# Patient Record
Sex: Female | Born: 2000 | Race: White | Hispanic: No | Marital: Single | Smoking: Never smoker
Health system: Southern US, Community
[De-identification: ages and names within clinical notes are randomized; demographics above are authoritative.]

## PROBLEM LIST (undated history)

## (undated) DIAGNOSIS — F419 Anxiety disorder, unspecified: Secondary | ICD-10-CM

## (undated) DIAGNOSIS — J45909 Unspecified asthma, uncomplicated: Secondary | ICD-10-CM

## (undated) HISTORY — DX: Unspecified asthma, uncomplicated: J45.909

## (undated) HISTORY — DX: Anxiety disorder, unspecified: F41.9

---
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
## 2022-01-19 ENCOUNTER — Other Ambulatory Visit: Payer: Self-pay | Admitting: Child and Adolescent Psychiatry

## 2022-01-23 ENCOUNTER — Ambulatory Visit: Payer: Medicaid Other | Admitting: Family

## 2022-02-04 ENCOUNTER — Ambulatory Visit (INDEPENDENT_AMBULATORY_CARE_PROVIDER_SITE_OTHER): Payer: Medicaid Other | Admitting: Family

## 2022-02-04 ENCOUNTER — Encounter: Payer: Self-pay | Admitting: Family

## 2022-02-04 ENCOUNTER — Ambulatory Visit (INDEPENDENT_AMBULATORY_CARE_PROVIDER_SITE_OTHER)
Admission: RE | Admit: 2022-02-04 | Discharge: 2022-02-04 | Disposition: A | Discharge status: Home / Self Care | Payer: Medicaid Other | Source: Ambulatory Visit | Attending: Family | Admitting: Family

## 2022-02-04 ENCOUNTER — Telehealth: Payer: Self-pay

## 2022-02-04 VITALS — BP 92/60 | HR 78 | Temp 98.1°F | Resp 16 | Ht 64.0 in | Wt 98.4 lb

## 2022-02-04 DIAGNOSIS — R739 Hyperglycemia, unspecified: Secondary | ICD-10-CM | POA: Insufficient documentation

## 2022-02-04 DIAGNOSIS — Z3041 Encounter for surveillance of contraceptive pills: Secondary | ICD-10-CM

## 2022-02-04 DIAGNOSIS — J4520 Mild intermittent asthma, uncomplicated: Secondary | ICD-10-CM

## 2022-02-04 DIAGNOSIS — Z13220 Encounter for screening for lipoid disorders: Secondary | ICD-10-CM | POA: Insufficient documentation

## 2022-02-04 DIAGNOSIS — R5383 Other fatigue: Secondary | ICD-10-CM | POA: Insufficient documentation

## 2022-02-04 DIAGNOSIS — F4310 Post-traumatic stress disorder, unspecified: Secondary | ICD-10-CM

## 2022-02-04 DIAGNOSIS — R634 Abnormal weight loss: Secondary | ICD-10-CM | POA: Insufficient documentation

## 2022-02-04 DIAGNOSIS — G479 Sleep disorder, unspecified: Secondary | ICD-10-CM | POA: Insufficient documentation

## 2022-02-04 DIAGNOSIS — R922 Inconclusive mammogram: Secondary | ICD-10-CM

## 2022-02-04 DIAGNOSIS — L709 Acne, unspecified: Secondary | ICD-10-CM | POA: Diagnosis not present

## 2022-02-04 DIAGNOSIS — N946 Dysmenorrhea, unspecified: Secondary | ICD-10-CM | POA: Diagnosis not present

## 2022-02-04 DIAGNOSIS — K219 Gastro-esophageal reflux disease without esophagitis: Secondary | ICD-10-CM

## 2022-02-04 DIAGNOSIS — K5900 Constipation, unspecified: Secondary | ICD-10-CM | POA: Insufficient documentation

## 2022-02-04 DIAGNOSIS — R0683 Snoring: Secondary | ICD-10-CM | POA: Insufficient documentation

## 2022-02-04 DIAGNOSIS — R9230 Dense breasts, unspecified: Secondary | ICD-10-CM | POA: Insufficient documentation

## 2022-02-04 DIAGNOSIS — R102 Pelvic and perineal pain: Secondary | ICD-10-CM | POA: Insufficient documentation

## 2022-02-04 LAB — POCT URINE PREGNANCY: Preg Test, Ur: NEGATIVE

## 2022-02-04 MED ORDER — TRI-SPRINTEC 0.18/0.215/0.25 MG-35 MCG PO TABS: 1.0000 | ORAL_TABLET | Freq: Every day | ORAL | 11 refills | Status: DC

## 2022-02-04 NOTE — Assessment & Plan Note (Signed)
Lipid panel ordered pending results.   

## 2022-02-04 NOTE — Assessment & Plan Note (Signed)
tsh ordered pending results Work on eating at least three times a day

## 2022-02-04 NOTE — Assessment & Plan Note (Addendum)
Referral for sleep study  R/o sleep apnea

## 2022-02-04 NOTE — Assessment & Plan Note (Signed)
US pelvic and transvaginal US  Pending results

## 2022-02-04 NOTE — Assessment & Plan Note (Signed)
continue trazodone and f/u with psychiatry as scheduled  Sleep hygiene

## 2022-02-04 NOTE — Assessment & Plan Note (Signed)
a1c ordered

## 2022-02-04 NOTE — Telephone Encounter (Signed)
Left message to return call to our office. Need pt to come back to the office to get her blood work taking.

## 2022-02-04 NOTE — Assessment & Plan Note (Signed)
Increase fiber miralax daily Increase water in take kub abd  Consider gi referral if needed

## 2022-02-04 NOTE — Assessment & Plan Note (Signed)
Cont f/u with psychiatry as scheduled Continue sertraline 50 mg

## 2022-02-04 NOTE — Assessment & Plan Note (Signed)
bil breast u/s  Although suspected fibrocystic breasts Decrease caffeine intake

## 2022-02-04 NOTE — Assessment & Plan Note (Signed)
pcos workup

## 2022-02-04 NOTE — Assessment & Plan Note (Signed)
pcos workup pending

## 2022-02-04 NOTE — Patient Instructions (Addendum)
A referral was placed today for sleep study.  Please let us know if you have not heard back within 2 weeks about the referral.  Your imaging for pelvic ultrasound as well as bilateral breast ultrasound, call to get scheduled. Has been scheduled at the following location:   [facility],  [place], [facility] Phone [phone],  8-430 pm   Welcome to our clinic, I am happy to have you as my new patient. I am excited to continue on this healthcare journey with you.  Stop by the lab prior to leaving today. I will notify you of your results once received.   Please keep in mind Any my chart messages you send have up to a three business day turnaround for a response.  Phone calls may take up to a one full business day turnaround for a  response.   If you need a medication refill I recommend you request it through the pharmacy as this is easiest for [place] rather than sending a message and or phone call.   Due to recent changes in healthcare laws, you may see results of your imaging and/or laboratory studies on MyChart before I have had a chance to review them.  I understand that in some cases there may be results that are confusing or concerning to you. Please understand that not all results are received at the same time and often I may need to interpret multiple results in order to provide you with the best plan of care or course of treatment. Therefore, I ask that you please give me 2 business days to thoroughly review all your results before contacting my office for clarification. Should we see a critical lab result, you will be contacted sooner.   It was a pleasure seeing you today! Please do not hesitate to reach out with any questions and or concerns.  Regards,   Eugenia Pancoast FNP-C

## 2022-02-04 NOTE — Assessment & Plan Note (Signed)
Fatigue workup pending results

## 2022-02-04 NOTE — Addendum Note (Signed)
Addended by: Eugenia Pancoast on: 02/04/2022 12:01 PM   Modules accepted: Orders

## 2022-02-04 NOTE — Assessment & Plan Note (Signed)
Continue albuterol prn °

## 2022-02-04 NOTE — Progress Notes (Signed)
R   New Patient Office Visit  Subjective:  Patient ID: Suzanne Reed, female    DOB: Feb 04, 2001  Age: 21 y.o. MRN: [id number]  CC:  Chief Complaint  Patient presents with   Establish Care    HPI Suzanne Reed is here to establish care as a new patient.  Prior provider was: pedatrician, Dr. Theresia Bough Nogo  Pt is with acute concerns.   Increased bloating over the last few months and increased constipation. Having to take laxatives for this, thought at first bloating was from birth control. No abd pain only occasional in pelvic area. Periods now regular , not overly heavy. Used to be painful, but no longer. Has lost weight in the last few months or so. On average can go about 5-7 days on average. No blood in stool.   Wt Readings from Last 3 Encounters:  02/04/22 98 lb 6 oz (44.6 kg) (3 %, Z= -1.94)*  11/29/21 134 lb (60.8 kg) (63 %, Z= 0.33)*   * Growth percentiles are based on CDC (Girls, 2-20 Years) data.  Pt does state she does not feel she has ever been 134 pounds, usually around 103. Hard for her to gain weight.   Around breast, she feels pain at times. She does have wire on her bra but normal bra doesn't typically have wire. Pain around the breast at times, on a normal day 2/10. Doesn't really drink caffeine, occasional coffee.   chronic concerns:  PTSD, major depression: sees psychiatry every few months. On sertraline 25 mg and trazodone 50 mg nightly. Used to see a therapist in the past, but wasn't very helpful. Pt wil consider trying to get established with someone else.   Snoring, stops breathing per boyfriend, states she stops breathing while sleeping and he has to wake up her up. But has not been for about one month her boyfriend states.   Acid reflux: food dependent, when she eats spicy foods. Takes prilosec as needed. Not at current.  History reviewed. No pertinent past medical history.  History reviewed. No pertinent surgical history.  History reviewed. No pertinent family  history.  Social History   Socioeconomic History   Marital status: Single    Spouse name: Not on file   Number of children: Not on file   Years of education: Not on file   Highest education level: Not on file  Occupational History   Not on file  Tobacco Use   Smoking status: Never   Smokeless tobacco: Never  Vaping Use   Vaping Use: Never used  Substance and Sexual Activity   Alcohol use: Never   Drug use: Never   Sexual activity: Yes    Birth control/protection: Condom  Other Topics Concern   Not on file  Social History Narrative   Not on file   Social Determinants of Health   Financial Resource Strain: Not on file  Food Insecurity: Not on file  Transportation Needs: Not on file  Physical Activity: Not on file  Stress: Not on file  Social Connections: Not on file  Intimate Partner Violence: Not on file    Outpatient Medications Prior to Visit  Medication Sig Dispense Refill   loratadine (CLARITIN) 10 MG tablet Take 1 tablet by mouth daily.     montelukast (SINGULAIR) 10 MG tablet Take 10 mg by mouth at bedtime.     omeprazole (PRILOSEC) 20 MG capsule Take 20 mg by mouth daily.     PROAIR HFA 108 (90 Base) MCG/ACT inhaler Inhale 2  puffs into the lungs every 4 (four) hours as needed.     sertraline (ZOLOFT) 25 MG tablet Take 1 tablet (25 mg total) by mouth daily. 30 tablet 1   traZODone (DESYREL) 50 MG tablet TAKE 1/2 TO 1 TABLET BY MOUTH BY MOUTH AT BEDTIME AS NEEDED FOR SLEEP 30 tablet 0   loratadine (CLARITIN) 10 MG tablet Take 10 mg by mouth daily.     metoCLOPramide (REGLAN) 5 MG tablet Take 1 tablet (5 mg total) by mouth every 6 (six) hours as needed for nausea. 40 tablet 0   sertraline (ZOLOFT) 50 MG tablet Take 50 mg by mouth daily.     TRI-SPRINTEC 0.18/0.215/0.25 MG-35 MCG tablet Take 1 tablet by mouth daily.     No facility-administered medications prior to visit.    No Known Allergies      Objective:    Physical Exam Vitals reviewed.   Constitutional:      General: She is not in acute distress.    Appearance: She is underweight. She is not ill-appearing or toxic-appearing.  HENT:     Right Ear: Tympanic membrane normal.     Left Ear: Tympanic membrane normal.     Mouth/Throat:     Mouth: Mucous membranes are moist.     Pharynx: No pharyngeal swelling.     Tonsils: No tonsillar exudate.  Eyes:     Extraocular Movements: Extraocular movements intact.     Conjunctiva/sclera: Conjunctivae normal.     Pupils: Pupils are equal, round, and reactive to light.  Neck:     Thyroid: No thyroid mass.  Cardiovascular:     Rate and Rhythm: Normal rate and regular rhythm.  Pulmonary:     Effort: Pulmonary effort is normal.     Breath sounds: Normal breath sounds.  Chest:  Breasts:    Breasts are symmetrical.     Right: Tenderness present. No nipple discharge or skin change.     Left: Tenderness present. No nipple discharge or skin change.     Comments: Bil breast densities Abdominal:     General: Abdomen is flat. Bowel sounds are normal.     Palpations: Abdomen is soft.     Tenderness: There is abdominal tenderness in the suprapubic area.     Comments: Bil pelvic tenderness   Musculoskeletal:        General: Normal range of motion.  Lymphadenopathy:     Cervical:     Right cervical: No superficial cervical adenopathy.    Left cervical: No superficial cervical adenopathy.     Upper Body:     Right upper body: No supraclavicular or axillary adenopathy.     Left upper body: No supraclavicular or axillary adenopathy.  Skin:    General: Skin is warm.     Capillary Refill: Capillary refill takes less than 2 seconds.  Neurological:     General: No focal deficit present.     Mental Status: She is alert and oriented to person, place, and time.  Psychiatric:        Mood and Affect: Mood normal.        Behavior: Behavior normal.        Thought Content: Thought content normal.        Judgment: Judgment normal.     BP  92/60   Pulse 78   Temp 98.1 F (36.7 C)   Resp 16   Ht '5\' 4"'$  (1.626 m)   Wt 98 lb 6 oz (44.6 kg)   LMP  01/14/2022 (Exact Date)   SpO2 100%   BMI 16.89 kg/m  Wt Readings from Last 3 Encounters:  02/04/22 98 lb 6 oz (44.6 kg) (3 %, Z= -1.94)*  11/29/21 134 lb (60.8 kg) (63 %, Z= 0.33)*   * Growth percentiles are based on CDC (Girls, 2-20 Years) data.     Health Maintenance Due  Topic Date Due   HPV VACCINES (1 - 2-dose series) Never done   CHLAMYDIA SCREENING  Never done   HIV Screening  Never done   Hepatitis C Screening  Never done   TETANUS/TDAP  Never done       Topic Date Due   HPV VACCINES (1 - 2-dose series) Never done    No results found for: "TSH" Lab Results  Component Value Date   WBC 6.6 11/29/2021   HGB 13.9 11/29/2021   HCT 40.4 11/29/2021   MCV 85.4 11/29/2021   PLT 281 11/29/2021   Lab Results  Component Value Date   NA 134 (L) 11/29/2021   K 3.2 (L) 11/29/2021   CO2 25 11/29/2021   GLUCOSE 106 (H) 11/29/2021   BUN 9 11/29/2021   CREATININE 0.73 11/29/2021   CALCIUM 9.2 11/29/2021   ANIONGAP 8 11/29/2021   No results found for: "CHOL" No results found for: "HDL" No results found for: "LDLCALC" No results found for: "TRIG" No results found for: "CHOLHDL" No results found for: "HGBA1C"    Assessment & Plan:   Problem List Items Addressed This Visit       Respiratory   Mild intermittent asthma without complication - Primary    Continue albuterol prn         Musculoskeletal and Integument   Acne    pcos workup pending      Relevant Medications   TRI-SPRINTEC 0.18/0.215/0.25 MG-35 MCG tablet   Other Relevant Orders   POCT urine pregnancy (Completed)     Genitourinary   Painful menstrual periods    pcos workup       Relevant Orders   POCT urine pregnancy (Completed)   Follicle stimulating hormone   Prolactin   Testos,Total,Free and SHBG (Female)   US PELVIC COMPLETE WITH TRANSVAGINAL     Other   PTSD  (post-traumatic stress disorder)    Cont f/u with psychiatry as scheduled Continue sertraline 50 mg      Sleep disorder    continue trazodone and f/u with psychiatry as scheduled  Sleep hygiene      Snoring    Referral for sleep study  R/o sleep apnea      Relevant Orders   Ambulatory referral to Pulmonology   Pelvic pain    US pelvic and transvaginal US  Pending results      Relevant Orders   US PELVIC COMPLETE WITH TRANSVAGINAL   Constipation    Increase fiber miralax daily Increase water in take kub abd  Consider gi referral if needed       Relevant Orders   DG Abd 1 View   US PELVIC COMPLETE WITH TRANSVAGINAL   Other fatigue    Fatigue workup pending results      Relevant Orders   Comprehensive metabolic panel   CBC with Differential/Platelet   Hyperglycemia    a1c ordered      Relevant Orders   Comprehensive metabolic panel   Hemoglobin A1c   Screening for lipoid disorders    Lipid panel ordered pending results.        Relevant Orders  Lipid panel   Weight loss    tsh ordered pending results Work on eating at least three times a day       Relevant Orders   TSH   Dense breast tissue    bil breast u/s  Although suspected fibrocystic breasts Decrease caffeine intake      Relevant Orders   US BREAST LTD UNI LEFT INC AXILLA   US BREAST LTD UNI RIGHT INC AXILLA   Other Visit Diagnoses     Encounter for surveillance of contraceptive pills       Relevant Medications   TRI-SPRINTEC 0.18/0.215/0.25 MG-35 MCG tablet       Meds ordered this encounter  Medications   TRI-SPRINTEC 0.18/0.215/0.25 MG-35 MCG tablet    Sig: Take 1 tablet by mouth daily.    Dispense:  28 tablet    Refill:  11    Order Specific Question:   Supervising Provider    Answer:   Diona Browner, AMY E [[id number]]    Follow-up: Return in about 1 month (around 03/06/2022) for f/u on appointment.    Eugenia Pancoast, FNP

## 2022-02-05 NOTE — Progress Notes (Signed)
Moderate stool amount in intestines. Recommend increasing water intake and miralax ok to take daily to get stool moving.

## 2022-02-06 ENCOUNTER — Other Ambulatory Visit (INDEPENDENT_AMBULATORY_CARE_PROVIDER_SITE_OTHER): Payer: Medicaid Other

## 2022-02-06 DIAGNOSIS — R739 Hyperglycemia, unspecified: Secondary | ICD-10-CM | POA: Diagnosis not present

## 2022-02-06 DIAGNOSIS — N946 Dysmenorrhea, unspecified: Secondary | ICD-10-CM

## 2022-02-06 DIAGNOSIS — Z13220 Encounter for screening for lipoid disorders: Secondary | ICD-10-CM

## 2022-02-06 DIAGNOSIS — R634 Abnormal weight loss: Secondary | ICD-10-CM

## 2022-02-06 DIAGNOSIS — R5383 Other fatigue: Secondary | ICD-10-CM | POA: Diagnosis not present

## 2022-02-06 LAB — COMPREHENSIVE METABOLIC PANEL
ALT: 11 U/L (ref 0–35)
AST: 16 U/L (ref 0–37)
Albumin: 4 g/dL (ref 3.5–5.2)
Alkaline Phosphatase: 66 U/L (ref 47–119)
BUN: 6 mg/dL (ref 6–23)
CO2: 27 mEq/L (ref 19–32)
Calcium: 9.2 mg/dL (ref 8.4–10.5)
Chloride: 103 mEq/L (ref 96–112)
Creatinine, Ser: 0.7 mg/dL (ref 0.40–1.20)
GFR: 125.48 mL/min (ref >60.00)
Glucose, Bld: 87 mg/dL (ref 70–99)
Potassium: 3.8 mEq/L (ref 3.5–5.1)
Sodium: 137 mEq/L (ref 135–145)
Total Bilirubin: 0.6 mg/dL (ref 0.2–1.2)
Total Protein: 6.6 g/dL (ref 6.0–8.3)

## 2022-02-06 LAB — LIPID PANEL
Cholesterol: 188 mg/dL (ref 0–200)
HDL: 70.5 mg/dL (ref >39.00)
LDL Cholesterol: 100 mg/dL — ABNORMAL HIGH (ref 0–99)
NonHDL: 117.81
Total CHOL/HDL Ratio: 3
Triglycerides: 88 mg/dL (ref 0.0–149.0)
VLDL: 17.6 mg/dL (ref 0.0–40.0)

## 2022-02-06 LAB — CBC WITH DIFFERENTIAL/PLATELET
Basophils Absolute: 0 10*3/uL (ref 0.0–0.1)
Basophils Relative: 0.3 % (ref 0.0–3.0)
Eosinophils Absolute: 0.1 10*3/uL (ref 0.0–0.7)
Eosinophils Relative: 1.4 % (ref 0.0–5.0)
HCT: 41.6 % (ref 36.0–49.0)
Hemoglobin: 14.1 g/dL (ref 12.0–16.0)
Lymphocytes Relative: 30.3 % (ref 24.0–48.0)
Lymphs Abs: 2.6 10*3/uL (ref 0.7–4.0)
MCHC: 34 g/dL (ref 31.0–37.0)
MCV: 88.1 fl (ref 78.0–98.0)
Monocytes Absolute: 0.7 10*3/uL (ref 0.1–1.0)
Monocytes Relative: 8.8 % (ref 3.0–12.0)
Neutro Abs: 5 10*3/uL (ref 1.4–7.7)
Neutrophils Relative %: 59.2 % (ref 43.0–71.0)
Platelets: 259 10*3/uL (ref 150.0–575.0)
RBC: 4.72 Mil/uL (ref 3.80–5.70)
RDW: 13 % (ref 11.4–15.5)
WBC: 8.5 10*3/uL (ref 4.5–13.5)

## 2022-02-06 LAB — TSH: TSH: 1.29 u[IU]/mL (ref 0.40–5.00)

## 2022-02-06 LAB — HEMOGLOBIN A1C: Hgb A1c MFr Bld: 5.1 % (ref 4.6–6.5)

## 2022-02-06 LAB — FOLLICLE STIMULATING HORMONE: FSH: 2.8 m[IU]/mL

## 2022-02-07 MED ORDER — PROAIR HFA 108 (90 BASE) MCG/ACT IN AERS: 2.0000 | INHALATION_SPRAY | RESPIRATORY_TRACT | 0 refills | Status: DC | PRN

## 2022-02-07 MED ORDER — OMEPRAZOLE 20 MG PO CPDR: 20.0000 mg | DELAYED_RELEASE_CAPSULE | Freq: Every day | ORAL | 0 refills | Status: DC

## 2022-02-07 MED ORDER — MONTELUKAST SODIUM 10 MG PO TABS: 10.0000 mg | ORAL_TABLET | Freq: Every day | ORAL | 1 refills | Status: DC

## 2022-02-10 ENCOUNTER — Telehealth: Payer: Self-pay | Admitting: Child and Adolescent Psychiatry

## 2022-02-11 ENCOUNTER — Telehealth: Payer: Medicaid Other | Admitting: Child and Adolescent Psychiatry

## 2022-02-13 ENCOUNTER — Other Ambulatory Visit: Payer: Self-pay | Admitting: Family

## 2022-02-13 LAB — TESTOS,TOTAL,FREE AND SHBG (FEMALE)
Free Testosterone: 2.5 pg/mL (ref 0.1–6.4)
Sex Hormone Binding: 80 nmol/L (ref 17–124)
Testosterone, Total, LC-MS-MS: 31 ng/dL (ref 2–45)

## 2022-02-13 LAB — PROLACTIN: Prolactin: 15.1 ng/mL

## 2022-02-13 MED ORDER — LORATADINE 10 MG PO TABS: 10.0000 mg | ORAL_TABLET | Freq: Every day | ORAL | 1 refills | Status: DC

## 2022-02-16 ENCOUNTER — Other Ambulatory Visit: Payer: Self-pay | Admitting: Child and Adolescent Psychiatry

## 2022-02-21 ENCOUNTER — Ambulatory Visit
Admission: RE | Admit: 2022-02-21 | Discharge: 2022-02-21 | Disposition: A | Discharge status: Home / Self Care | Payer: Medicaid Other | Source: Ambulatory Visit | Attending: Family | Admitting: Family

## 2022-02-21 DIAGNOSIS — N946 Dysmenorrhea, unspecified: Secondary | ICD-10-CM | POA: Insufficient documentation

## 2022-02-21 DIAGNOSIS — K5900 Constipation, unspecified: Secondary | ICD-10-CM | POA: Insufficient documentation

## 2022-02-21 DIAGNOSIS — R102 Pelvic and perineal pain: Secondary | ICD-10-CM | POA: Insufficient documentation

## 2022-03-08 ENCOUNTER — Encounter: Payer: Self-pay | Admitting: Family

## 2022-03-27 ENCOUNTER — Encounter: Payer: Self-pay | Admitting: Family

## 2022-03-30 ENCOUNTER — Encounter: Payer: Self-pay | Admitting: Family

## 2022-04-10 ENCOUNTER — Ambulatory Visit (INDEPENDENT_AMBULATORY_CARE_PROVIDER_SITE_OTHER): Payer: Medicaid Other | Admitting: Primary Care

## 2022-04-10 ENCOUNTER — Encounter: Payer: Self-pay | Admitting: Primary Care

## 2022-04-10 ENCOUNTER — Telehealth: Payer: Self-pay

## 2022-04-10 VITALS — BP 100/64 | HR 82 | Temp 98.6°F | Ht 64.0 in | Wt 100.0 lb

## 2022-04-10 DIAGNOSIS — R300 Dysuria: Secondary | ICD-10-CM | POA: Diagnosis not present

## 2022-04-10 LAB — POC URINALSYSI DIPSTICK (AUTOMATED)
Bilirubin, UA: NEGATIVE
Blood, UA: NEGATIVE
Glucose, UA: NEGATIVE
Leukocytes, UA: NEGATIVE
Nitrite, UA: NEGATIVE
Protein, UA: POSITIVE — AB
Spec Grav, UA: 1.01 (ref 1.010–1.025)
Urobilinogen, UA: 1 E.U./dL
pH, UA: 6 (ref 5.0–8.0)

## 2022-04-10 NOTE — Patient Instructions (Signed)
We will be in touch once we receive your urine test results.  Continue to drink plenty of water.  It was a pleasure meeting you!

## 2022-04-10 NOTE — Progress Notes (Signed)
Subjective:    Patient ID: Suzanne Reed, female    DOB: 2001-04-15, 21 y.o.   MRN: [id number]  Dysuria  Pertinent negatives include no chills or frequency.    Suzanne Reed is a very pleasant 21 y.o. female patient of Tabitha, NP with a history of dysmenorrhea, sleep disorder, PTSD, fatigue, who presents today to discuss dysuria.   Symptoms also include vaginal burning after urination, vaginal itching, vaginal irritation. All symptoms began about 2 weeks ago.   She denies vaginal discharge, hematuria, abdominal pain. LMP was 04/02/22. She took 2 AZO pills one week ago with some improvement.   She endorses a history of UTI, last one was one month ago, she did not seek treatment as her symptoms historically will resolve after taking AZO.   She is sexually active, does use condoms, same partner.    Review of Systems  Constitutional:  Negative for chills and fever.  Gastrointestinal:  Negative for abdominal pain.  Genitourinary:  Positive for dysuria. Negative for difficulty urinating, frequency, menstrual problem, pelvic pain and vaginal discharge.       Vaginal irritation         History reviewed. No pertinent past medical history.  Social History   Socioeconomic History   Marital status: Single    Spouse name: Not on file   Number of children: Not on file   Years of education: Not on file   Highest education level: Not on file  Occupational History   Not on file  Tobacco Use   Smoking status: Never   Smokeless tobacco: Never  Vaping Use   Vaping Use: Never used  Substance and Sexual Activity   Alcohol use: Never   Drug use: Never   Sexual activity: Yes    Birth control/protection: Condom  Other Topics Concern   Not on file  Social History Narrative   Not on file   Social Determinants of Health   Financial Resource Strain: Not on file  Food Insecurity: Not on file  Transportation Needs: Not on file  Physical Activity: Not on file  Stress: Not on file  Social  Connections: Not on file  Intimate Partner Violence: Not on file    History reviewed. No pertinent surgical history.  History reviewed. No pertinent family history.  No Known Allergies  Current Outpatient Medications on File Prior to Visit  Medication Sig Dispense Refill   loratadine (CLARITIN) 10 MG tablet Take 1 tablet (10 mg total) by mouth daily. 90 tablet 1   montelukast (SINGULAIR) 10 MG tablet Take 1 tablet (10 mg total) by mouth at bedtime. 90 tablet 1   omeprazole (PRILOSEC) 20 MG capsule Take 1 capsule (20 mg total) by mouth daily. 90 capsule 0   PROAIR HFA 108 (90 Base) MCG/ACT inhaler Inhale 2 puffs into the lungs every 4 (four) hours as needed. 1 each 0   traZODone (DESYREL) 50 MG tablet TAKE 1/2 TO 1 TABLET BY MOUTH BY MOUTH AT BEDTIME AS NEEDED FOR SLEEP 30 tablet 0   TRI-SPRINTEC 0.18/0.215/0.25 MG-35 MCG tablet Take 1 tablet by mouth daily. 28 tablet 11   sertraline (ZOLOFT) 25 MG tablet Take 1 tablet (25 mg total) by mouth daily. (Patient not taking: Reported on 04/10/2022) 30 tablet 1   No current facility-administered medications on file prior to visit.    BP 100/64   Pulse 82   Temp 98.6 F (37 C) (Oral)   Ht '5\' 4"'$  (1.626 m)   Wt 100 lb (45.4 kg)  SpO2 97%   BMI 17.16 kg/m  Objective:   Physical Exam Constitutional:      Appearance: She is not ill-appearing.  Cardiovascular:     Rate and Rhythm: Normal rate and regular rhythm.  Pulmonary:     Effort: Pulmonary effort is normal.     Breath sounds: Normal breath sounds.  Abdominal:     General: Bowel sounds are normal.     Palpations: Abdomen is soft.     Tenderness: There is abdominal tenderness in the suprapubic area.  Musculoskeletal:     Cervical back: Neck supple.  Skin:    General: Skin is warm and dry.           Assessment & Plan:   Problem List Items Addressed This Visit       Other   Dysuria - Primary    UA today grossly negative for infection. Reviewed recent glucose level  which was normal  Checking for STD's including gonorrhea, chlamydia, trichomonas.   Encouraged good water intake. Await results.       Relevant Orders   C. trachomatis/N. gonorrhoeae RNA   Trichomonas vaginalis, RNA   POCT Urinalysis Dipstick (Automated) (Completed)       Pleas Koch, NP

## 2022-04-10 NOTE — Assessment & Plan Note (Addendum)
UA today grossly negative for infection. Reviewed recent glucose level which was normal  Checking for STD's including gonorrhea, chlamydia, trichomonas.   Encouraged good water intake. Await results.

## 2022-04-10 NOTE — Telephone Encounter (Signed)
Pt called wondering why there would be protein in her urine. She googled it and now she is worried that it has something to do with her kidney. She would like a explanation.

## 2022-04-11 LAB — C. TRACHOMATIS/N. GONORRHOEAE RNA
C. trachomatis RNA, TMA: DETECTED — AB
N. gonorrhoeae RNA, TMA: NOT DETECTED

## 2022-04-11 LAB — TRICHOMONAS VAGINALIS, PROBE AMP: Trichomonas vaginalis RNA: NOT DETECTED

## 2022-04-12 ENCOUNTER — Encounter: Payer: Self-pay | Admitting: Family

## 2022-04-13 ENCOUNTER — Other Ambulatory Visit: Payer: Self-pay | Admitting: Primary Care

## 2022-04-13 DIAGNOSIS — A749 Chlamydial infection, unspecified: Secondary | ICD-10-CM

## 2022-04-13 MED ORDER — DOXYCYCLINE HYCLATE 100 MG PO CAPS: 100.0000 mg | ORAL_CAPSULE | Freq: Two times a day (BID) | ORAL | 0 refills | Status: DC

## 2022-04-13 NOTE — Telephone Encounter (Signed)
Please ensure patient has reviewed my comments under her results in [place].  She tested positive for chlamydia, I suspect the protein in her urine is secondary to that.  I do recommend she follow-up with Tabatha for any ongoing symptoms and to repeat urinalysis in the future when she has completed her antibiotic treatment for chlamydia.

## 2022-04-14 NOTE — Telephone Encounter (Signed)
Noted, thanks for seeing her Dr. Carlis Abbott.

## 2022-04-24 ENCOUNTER — Encounter: Payer: Self-pay | Admitting: Family

## 2022-04-24 DIAGNOSIS — G479 Sleep disorder, unspecified: Secondary | ICD-10-CM

## 2022-04-24 DIAGNOSIS — F4310 Post-traumatic stress disorder, unspecified: Secondary | ICD-10-CM

## 2022-04-25 ENCOUNTER — Encounter: Payer: Self-pay | Admitting: Family

## 2022-05-04 ENCOUNTER — Encounter: Payer: Self-pay | Admitting: Family

## 2022-05-06 NOTE — Telephone Encounter (Signed)
Left message to return call to our office. Pt needs an appointment.

## 2022-06-23 ENCOUNTER — Encounter: Payer: Self-pay | Admitting: Family

## 2022-06-23 ENCOUNTER — Ambulatory Visit (INDEPENDENT_AMBULATORY_CARE_PROVIDER_SITE_OTHER): Payer: Medicaid Other | Admitting: Family

## 2022-06-23 VITALS — BP 102/62 | HR 76 | Temp 98.2°F | Resp 16 | Ht 64.0 in | Wt 105.0 lb

## 2022-06-23 DIAGNOSIS — K5909 Other constipation: Secondary | ICD-10-CM

## 2022-06-23 DIAGNOSIS — K5900 Constipation, unspecified: Secondary | ICD-10-CM

## 2022-06-23 MED ORDER — LINACLOTIDE 145 MCG PO CAPS: 145.0000 ug | ORAL_CAPSULE | Freq: Every day | ORAL | 0 refills | Status: DC

## 2022-06-23 NOTE — Assessment & Plan Note (Signed)
Trial start linzess  Work on diet and recommend that you start eating every 2-3 hours. Increase your water intake to a goal of about 64 ounces.  Referral placed for GI if ongoing.

## 2022-06-23 NOTE — Patient Instructions (Addendum)
A referral was placed today for GI.  Please let us know if you have not heard back within 2 weeks about the referral.  Add fiber supplement once daily.  Add a probiotic (such as Florastor) daily. Drink 64 oz of water a day. Eat lots of fresh fruit and veggies. Ensure regular exercise.    If you are not able to have regular BM's with the above regimen, you may add miralax 1 tablespoon daily.  Increase or decrease amount/frequency as needed to ensure 1 soft BM/day.    Regards,   Eugenia Pancoast FNP-C

## 2022-06-23 NOTE — Progress Notes (Signed)
Established Patient Office Visit  Subjective:  Patient ID: Suzanne Reed, female    DOB: Oct 18, 2000  Age: 21 y.o. MRN: [id number]  CC:  Chief Complaint  Patient presents with   Bloated   Constipation    X a couple of months    HPI Suzanne Reed is here today with concerns.   She is still having constipation and feeling bloated. She is trying benefiber over the counter with only mild relief. She is still feeling bloated. Her last bowel movement was about one week ago, and when she does have a bowel movement she has barely any bowel movement. She doesn't feel complete relief. This is tpyical for her to not have a bowel movement 1-2 times a week.it has worsened to once a week or once every two weeks.she has tried to take miralax once daily, but states that this is not overly helpful for her.  On average she will eat breakfast and dinner. Typically for breakfast she will eat a donut, and or a sausage biscuit. If she is home she will eat cereal and or oatmeal. For dinner she will typically eat a protein and vegetable. Last night had hamburgers and broccoli with noodles.   She drinks about three bottles of water daily.   FYI, she has also stopped taking her birth control as she did not want to be on this anymore. Periods since being off birth control they are starting to settle back, last period was about one month ago, due again in four days. Periods are not overly painful. Periods are pretty heavy. Bloating does not seem to fluctuate with her periods.   Wt Readings from Last 3 Encounters:  06/23/22 105 lb (47.6 kg) (8 %, Z= -1.39)*  04/10/22 100 lb (45.4 kg) (4 %, Z= -1.80)*  02/04/22 98 lb 6 oz (44.6 kg) (3 %, Z= -1.94)*   * Growth percentiles are based on CDC (Girls, 2-20 Years) data.     No past medical history on file.  No past surgical history on file.  No family history on file.  Social History   Socioeconomic History   Marital status: Single    Spouse name: Not on file    Number of children: Not on file   Years of education: Not on file   Highest education level: Not on file  Occupational History   Not on file  Tobacco Use   Smoking status: Never   Smokeless tobacco: Never  Vaping Use   Vaping Use: Never used  Substance and Sexual Activity   Alcohol use: Never   Drug use: Never   Sexual activity: Yes    Birth control/protection: Condom  Other Topics Concern   Not on file  Social History Narrative   Not on file   Social Determinants of Health   Financial Resource Strain: Not on file  Food Insecurity: Not on file  Transportation Needs: Not on file  Physical Activity: Not on file  Stress: Not on file  Social Connections: Not on file  Intimate Partner Violence: Not on file    Outpatient Medications Prior to Visit  Medication Sig Dispense Refill   loratadine (CLARITIN) 10 MG tablet Take 1 tablet (10 mg total) by mouth daily. 90 tablet 1   montelukast (SINGULAIR) 10 MG tablet Take 1 tablet (10 mg total) by mouth at bedtime. 90 tablet 1   omeprazole (PRILOSEC) 20 MG capsule Take 1 capsule (20 mg total) by mouth daily. 90 capsule 0   PROAIR HFA 108 (  90 Base) MCG/ACT inhaler Inhale 2 puffs into the lungs every 4 (four) hours as needed. 1 each 0   sertraline (ZOLOFT) 25 MG tablet Take 1 tablet (25 mg total) by mouth daily. 30 tablet 1   traZODone (DESYREL) 50 MG tablet TAKE 1/2 TO 1 TABLET BY MOUTH BY MOUTH AT BEDTIME AS NEEDED FOR SLEEP 30 tablet 0   TRI-SPRINTEC 0.18/0.215/0.25 MG-35 MCG tablet Take 1 tablet by mouth daily. 28 tablet 11   doxycycline (VIBRAMYCIN) 100 MG capsule Take 1 capsule (100 mg total) by mouth 2 (two) times daily. For infection. 14 capsule 0   No facility-administered medications prior to visit.    No Known Allergies      Objective:    Physical Exam Constitutional:      General: She is not in acute distress.    Appearance: Normal appearance. She is not ill-appearing, toxic-appearing or diaphoretic.   Cardiovascular:     Rate and Rhythm: Normal rate and regular rhythm.  Pulmonary:     Effort: Pulmonary effort is normal.  Abdominal:     General: Abdomen is flat.     Palpations: There is no mass.     Tenderness: There is abdominal tenderness in the left lower quadrant. There is no rebound.     Hernia: No hernia is present.  Neurological:     General: No focal deficit present.     Mental Status: She is alert and oriented to person, place, and time. Mental status is at baseline.  Psychiatric:        Mood and Affect: Mood normal.        Behavior: Behavior normal.        Thought Content: Thought content normal.        Judgment: Judgment normal.     BP 102/62   Pulse 76   Temp 98.2 F (36.8 C)   Resp 16   Ht '5\' 4"'$  (1.626 m)   Wt 105 lb (47.6 kg)   LMP 05/30/2022 (Exact Date)   SpO2 99%   BMI 18.02 kg/m  Wt Readings from Last 3 Encounters:  06/23/22 105 lb (47.6 kg) (8 %, Z= -1.39)*  04/10/22 100 lb (45.4 kg) (4 %, Z= -1.80)*  02/04/22 98 lb 6 oz (44.6 kg) (3 %, Z= -1.94)*   * Growth percentiles are based on CDC (Girls, 2-20 Years) data.     Health Maintenance Due  Topic Date Due   HPV VACCINES (1 - 2-dose series) Never done   HIV Screening  Never done   Hepatitis C Screening  Never done   TETANUS/TDAP  Never done       Topic Date Due   HPV VACCINES (1 - 2-dose series) Never done    Lab Results  Component Value Date   TSH 1.29 02/06/2022   Lab Results  Component Value Date   WBC 8.5 02/06/2022   HGB 14.1 02/06/2022   HCT 41.6 02/06/2022   MCV 88.1 02/06/2022   PLT 259.0 02/06/2022   Lab Results  Component Value Date   NA 137 02/06/2022   K 3.8 02/06/2022   CO2 27 02/06/2022   GLUCOSE 87 02/06/2022   BUN 6 02/06/2022   CREATININE 0.70 02/06/2022   BILITOT 0.6 02/06/2022   ALKPHOS 66 02/06/2022   AST 16 02/06/2022   ALT 11 02/06/2022   PROT 6.6 02/06/2022   ALBUMIN 4.0 02/06/2022   CALCIUM 9.2 02/06/2022   ANIONGAP 8 11/29/2021   GFR 125.48  02/06/2022  Lab Results  Component Value Date   HGBA1C 5.1 02/06/2022      Assessment & Plan:   Problem List Items Addressed This Visit       Digestive   Chronic constipation - Primary    Trial start linzess  Work on diet and recommend that you start eating every 2-3 hours. Increase your water intake to a goal of about 64 ounces.  Referral placed for GI if ongoing.        Relevant Medications   linaclotide (LINZESS) 145 MCG CAPS capsule   Other Relevant Orders   Ambulatory referral to Gastroenterology     Other   RESOLVED: Constipation    Trial start linzess  Work on diet and recommend that you start eating every 2-3 hours. Increase your water intake to a goal of about 64 ounces.       Relevant Orders   Ambulatory referral to Gastroenterology    Meds ordered this encounter  Medications   linaclotide (LINZESS) 145 MCG CAPS capsule    Sig: Take 1 capsule (145 mcg total) by mouth daily before breakfast.    Dispense:  30 capsule    Refill:  0    Order Specific Question:   Supervising Provider    Answer:   Diona Browner, AMY E [[id number]]    Follow-up: Return in about 1 month (around 07/23/2022) for f/u constipation .    Eugenia Pancoast, FNP

## 2022-06-23 NOTE — Assessment & Plan Note (Signed)
Trial start linzess  Work on diet and recommend that you start eating every 2-3 hours. Increase your water intake to a goal of about 64 ounces.

## 2022-06-24 ENCOUNTER — Encounter: Payer: Self-pay | Admitting: Family

## 2022-07-18 ENCOUNTER — Encounter: Payer: Self-pay | Admitting: Family

## 2022-07-18 NOTE — Telephone Encounter (Signed)
Noted  

## 2022-07-18 NOTE — Telephone Encounter (Signed)
I spoke with pt; pt said first time had pain with sex was last wk; today during sex pt had lower pain all the way across lower abd that continues now. Pt said if stands lower abd pain is sharp but if sitting more of a pressure feeling. Pt said abd pain level now is 5-6. Pt has usual vaginal discharge with no vaginal or perineal irritation.pt said she does not have fever. No UTI symptoms.Pt said LMP 06/26/22. Pt stopped any form of birth control 1 - 2 months ago. Pt does not have gyn. Pt said she will get someone to take her to UC for evaluation this evening. Pt will cb next wk with update. Sending not to Red Christians FNP who is out of office, dugal pool and Gentry Fitz NP who is in office.

## 2022-07-19 ENCOUNTER — Other Ambulatory Visit: Payer: Self-pay | Admitting: Family

## 2022-07-19 DIAGNOSIS — K5909 Other constipation: Secondary | ICD-10-CM

## 2022-07-21 NOTE — Telephone Encounter (Signed)
Left message to return call to our office. Pt needs an appointment to see Tabitha.

## 2022-07-21 NOTE — Telephone Encounter (Signed)
Agree with recommendations.   Claiborne Billings can we please call to f/u with pt? Sounds like sh needs an appt if wasn't addressed. Especially with recent h/o STD

## 2022-07-24 ENCOUNTER — Encounter: Payer: Self-pay | Admitting: Family

## 2022-07-24 NOTE — Telephone Encounter (Signed)
Patient called at 2:35 today stating she feels better and has no problems so she does not need an appointment and that she is getting refills for birth control that she has been off of for months.

## 2022-07-24 NOTE — Telephone Encounter (Signed)
Left message for Patient to call back to schedule

## 2022-07-24 NOTE — Telephone Encounter (Signed)
Patient returned call after this and states she feels better and does not need an appointment.

## 2022-08-22 ENCOUNTER — Other Ambulatory Visit: Payer: Self-pay | Admitting: Family

## 2022-08-22 DIAGNOSIS — K5909 Other constipation: Secondary | ICD-10-CM

## 2022-08-26 IMAGING — DX DG ABDOMEN 1V
2 series · 2 of 2 positions shown · non-contrast
Comparison: Remote CT 11/01/2008

CLINICAL DATA: Constipation.  Abdominal discomfort.

EXAM:
ABDOMEN - 1 VIEW

[abdomen supine ap (1 of 2)]
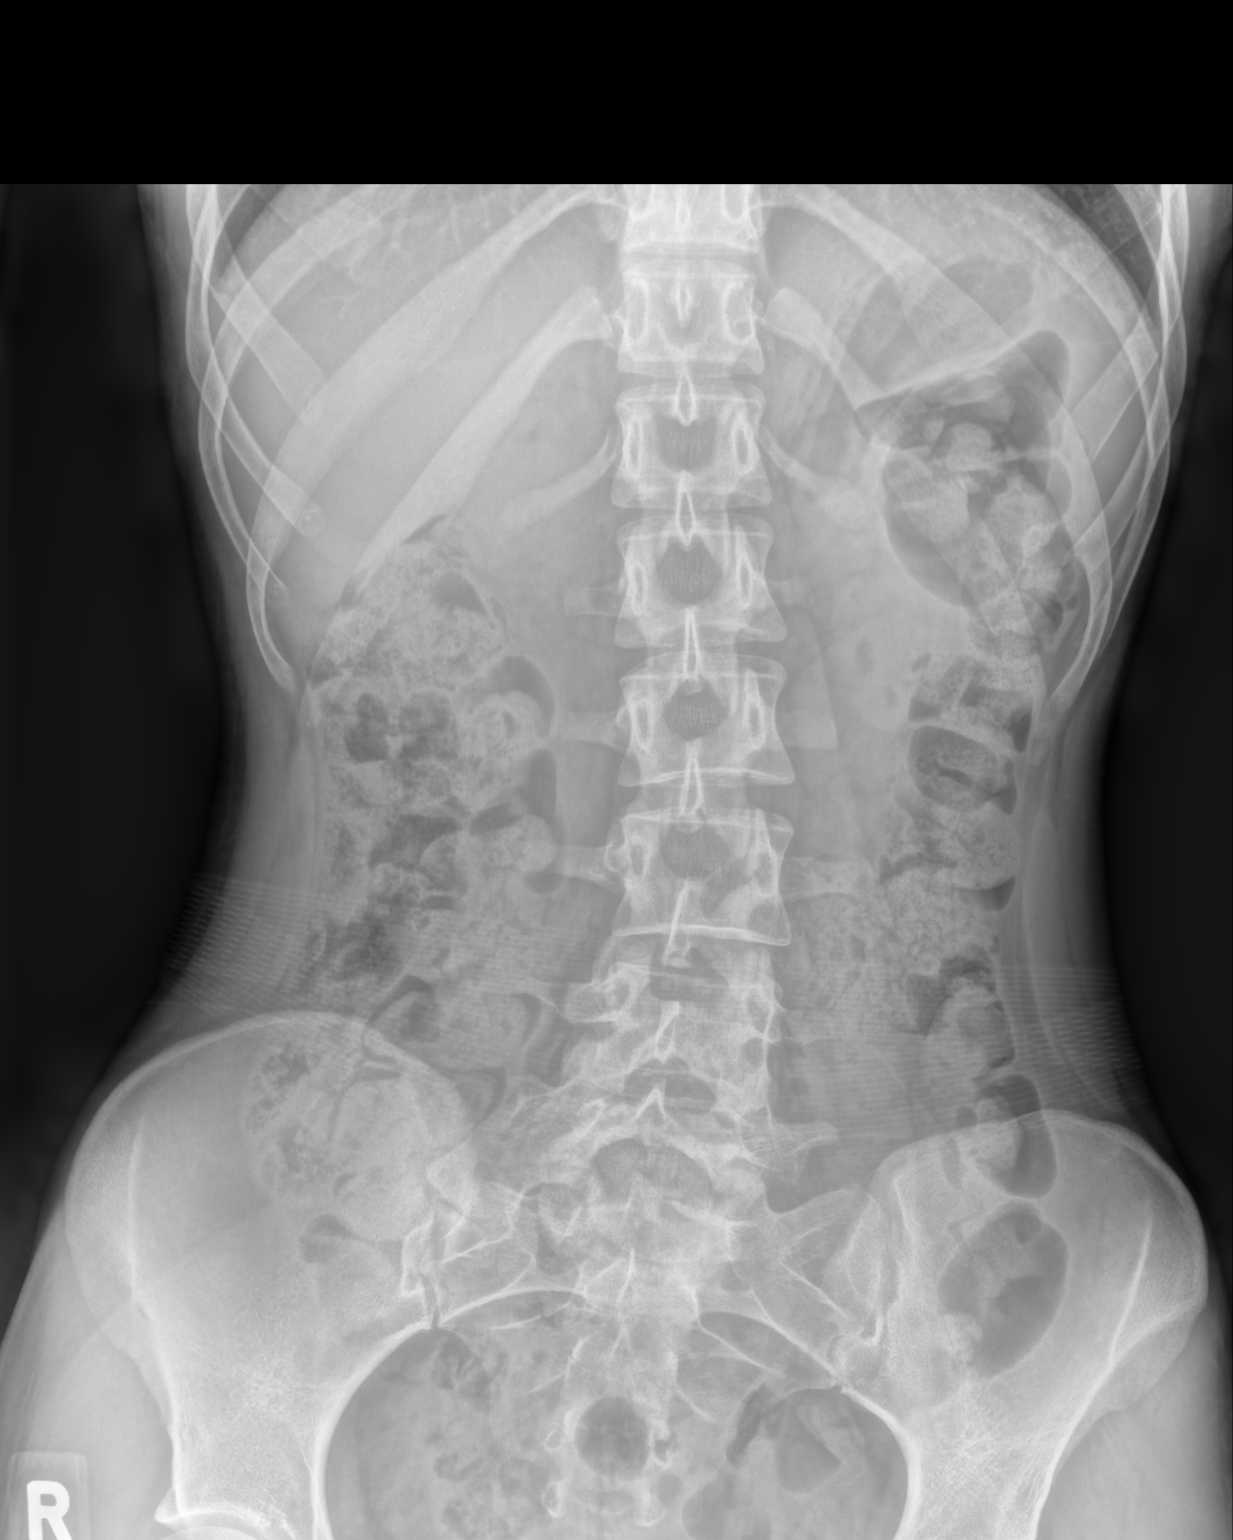

[abdomen supine ap (2 of 2)]
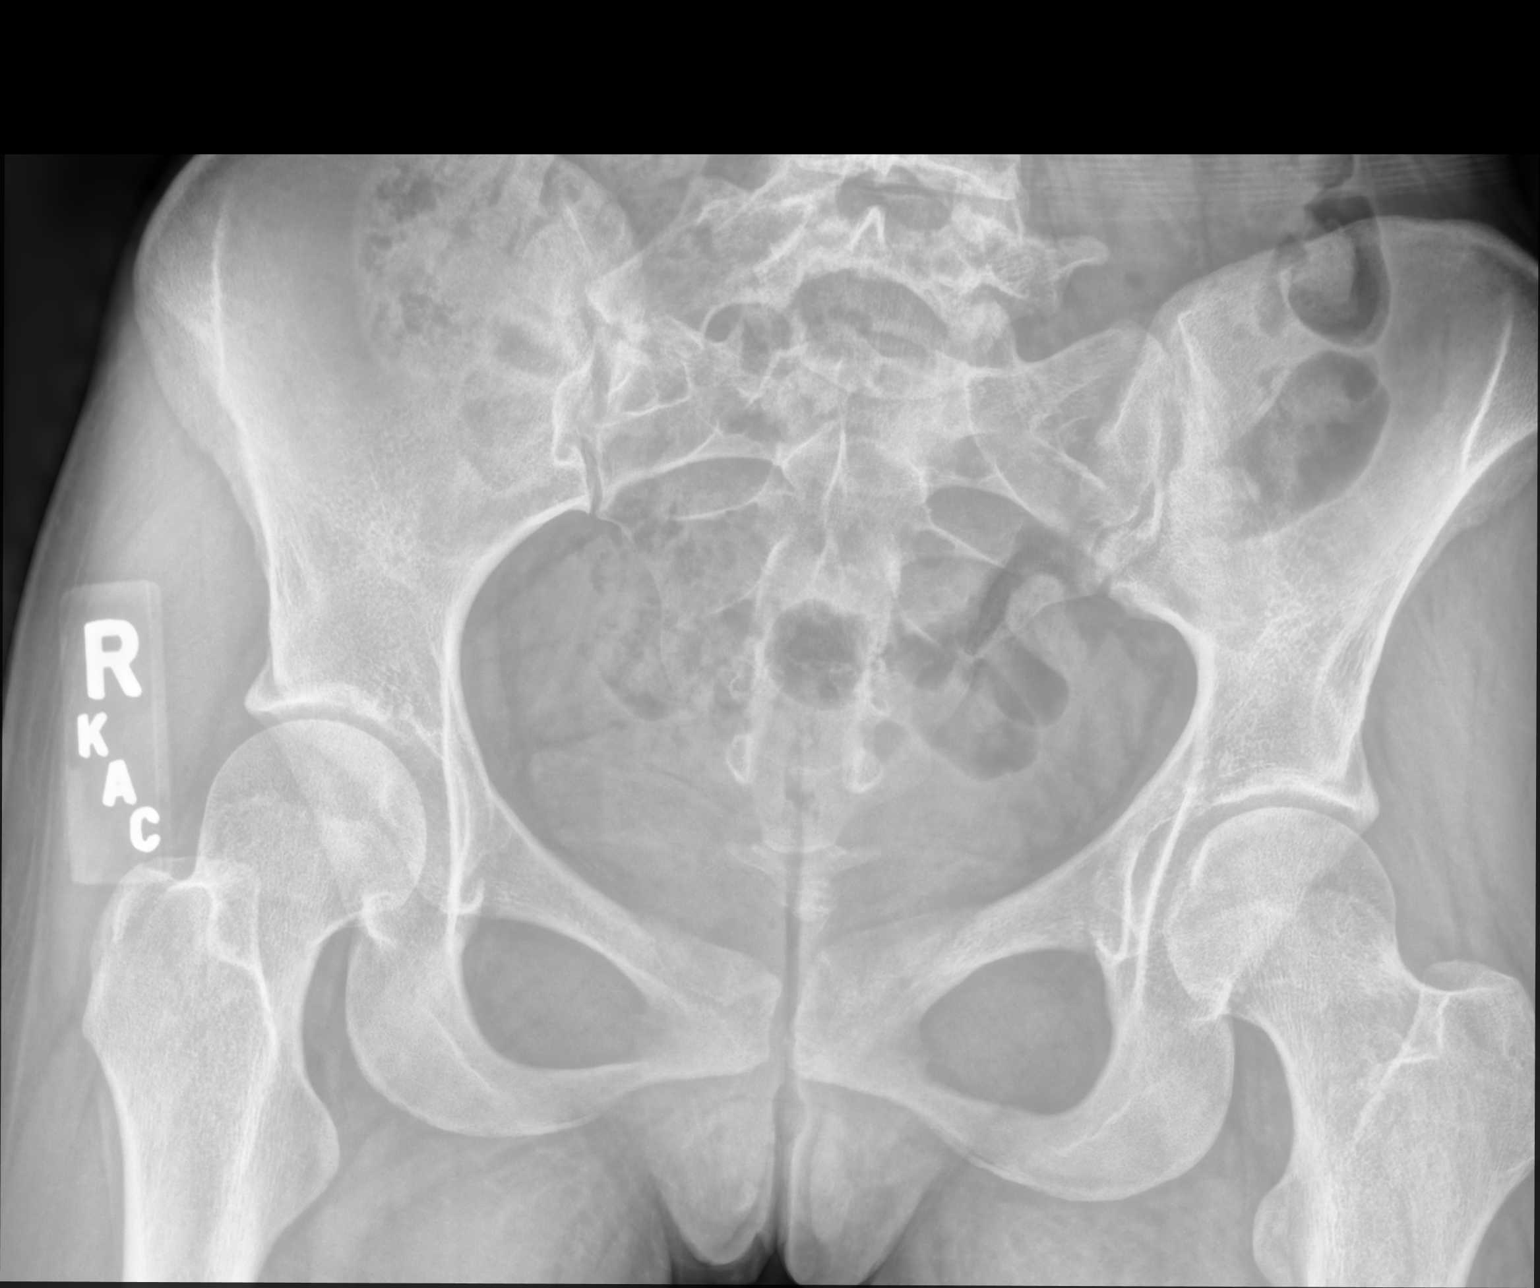

[2 of 2 positions shown; findings below may reference images not displayed]

FINDINGS: Divided supine views of the abdomen obtained. Moderate diffuse
colonic stool burden, no abnormal rectal distention. No small bowel
dilatation or evidence of obstruction. No abnormal gastric
distension. No concerning intraabdominal mass effect. No radiopaque
calculi. The included lung bases are clear. Normal osseous
structures.
IMPRESSION: Moderate diffuse colonic stool burden. No bowel obstruction.

## 2022-09-02 ENCOUNTER — Encounter: Payer: Self-pay | Admitting: Family

## 2022-09-03 ENCOUNTER — Telehealth: Payer: Self-pay | Admitting: Gastroenterology

## 2022-09-03 NOTE — Telephone Encounter (Signed)
Call pt back when may schd opens up

## 2022-09-08 ENCOUNTER — Telehealth: Payer: Medicaid Other | Admitting: Physician Assistant

## 2022-09-08 DIAGNOSIS — J019 Acute sinusitis, unspecified: Secondary | ICD-10-CM | POA: Diagnosis not present

## 2022-09-08 DIAGNOSIS — B9789 Other viral agents as the cause of diseases classified elsewhere: Secondary | ICD-10-CM

## 2022-09-08 MED ORDER — FLUTICASONE PROPIONATE 50 MCG/ACT NA SUSP: 2.0000 | Freq: Every day | NASAL | 0 refills | Status: DC

## 2022-09-08 MED ORDER — BENZONATATE 100 MG PO CAPS: 100.0000 mg | ORAL_CAPSULE | Freq: Three times a day (TID) | ORAL | 0 refills | Status: DC | PRN

## 2022-09-08 NOTE — Progress Notes (Signed)
E-Visit for Sinus Problems  We are sorry that you are not feeling well.  Here is how we plan to help!  Based on what you have shared with me it looks like you have sinusitis.  Sinusitis is inflammation and infection in the sinus cavities of the head.  Based on your presentation I believe you most likely have Acute Viral Sinusitis.This is an infection most likely caused by a virus. There is not specific treatment for viral sinusitis other than to help you with the symptoms until the infection runs its course.  You may use an oral decongestant such as Mucinex D or if you have glaucoma or high blood pressure use plain Mucinex. Saline nasal spray help and can safely be used as often as needed for congestion, I have prescribed: Fluticasone nasal spray two sprays in each nostril once a day and Tessalon perles for cough.  Some authorities believe that zinc sprays or the use of Echinacea may shorten the course of your symptoms.  Sinus infections are not as easily transmitted as other respiratory infection, however we still recommend that you avoid close contact with loved ones, especially the very young and elderly.  Remember to wash your hands thoroughly throughout the day as this is the number one way to prevent the spread of infection!  Home Care: Only take medications as instructed by your medical team. Do not take these medications with alcohol. A steam or ultrasonic humidifier can help congestion.  You can place a towel over your head and breathe in the steam from hot water coming from a faucet. Avoid close contacts especially the very young and the elderly. Cover your mouth when you cough or sneeze. Always remember to wash your hands.  Get Help Right Away If: You develop worsening fever or sinus pain. You develop a severe head ache or visual changes. Your symptoms persist after you have completed your treatment plan.  Make sure you Understand these instructions. Will watch your  condition. Will get help right away if you are not doing well or get worse.   Thank you for choosing an e-visit.  Your e-visit answers were reviewed by a board certified advanced clinical practitioner to complete your personal care plan. Depending upon the condition, your plan could have included both over the counter or prescription medications.  Please review your pharmacy choice. Make sure the pharmacy is open so you can pick up prescription now. If there is a problem, you may contact your provider through [employer] and have the prescription routed to another pharmacy.  Your safety is important to [place]. If you have drug allergies check your prescription carefully.   For the next 24 hours you can use MyChart to ask questions about today's visit, request a non-urgent call back, or ask for a work or school excuse. You will get an email in the next two days asking about your experience. I hope that your e-visit has been valuable and will speed your recovery.  I have spent 5 minutes in review of e-visit questionnaire, review and updating patient chart, medical decision making and response to patient.   Mar Daring, PA-C

## 2022-09-13 ENCOUNTER — Encounter: Payer: Self-pay | Admitting: Family

## 2022-11-01 ENCOUNTER — Other Ambulatory Visit: Payer: Self-pay | Admitting: Family

## 2022-11-03 ENCOUNTER — Other Ambulatory Visit: Payer: Self-pay | Admitting: Family

## 2022-11-03 DIAGNOSIS — K219 Gastro-esophageal reflux disease without esophagitis: Secondary | ICD-10-CM

## 2022-11-03 DIAGNOSIS — J4520 Mild intermittent asthma, uncomplicated: Secondary | ICD-10-CM

## 2022-11-03 MED ORDER — LORATADINE 10 MG PO TABS
10.0000 mg | ORAL_TABLET | Freq: Every day | ORAL | 0 refills | Status: DC
Start: 2022-11-03 — End: 2023-01-07

## 2022-11-03 MED ORDER — PROAIR HFA 108 (90 BASE) MCG/ACT IN AERS: 2.0000 | INHALATION_SPRAY | RESPIRATORY_TRACT | 0 refills | Status: DC | PRN

## 2022-11-03 NOTE — Telephone Encounter (Signed)
Refil for loratadine (CLARITIN) 10 MG tablet   LV- 06/23/22 LR- 02/13/22 ( 90tabs/1 refill) NV- not scheduled

## 2023-01-07 ENCOUNTER — Other Ambulatory Visit: Payer: Self-pay | Admitting: Family

## 2023-01-07 MED ORDER — LORATADINE 10 MG PO TABS: 10.0000 mg | ORAL_TABLET | Freq: Every day | ORAL | 3 refills | Status: DC

## 2023-03-23 ENCOUNTER — Encounter: Payer: Self-pay | Admitting: Family

## 2023-03-26 ENCOUNTER — Ambulatory Visit: Payer: Medicaid Other | Admitting: Family

## 2023-03-27 ENCOUNTER — Encounter: Payer: Self-pay | Admitting: Family

## 2023-04-03 ENCOUNTER — Other Ambulatory Visit: Payer: Self-pay | Admitting: Family

## 2023-04-03 DIAGNOSIS — K219 Gastro-esophageal reflux disease without esophagitis: Secondary | ICD-10-CM

## 2023-04-03 DIAGNOSIS — J4520 Mild intermittent asthma, uncomplicated: Secondary | ICD-10-CM

## 2023-04-06 ENCOUNTER — Encounter: Payer: Self-pay | Admitting: Family

## 2023-04-06 ENCOUNTER — Other Ambulatory Visit: Payer: Self-pay | Admitting: Family

## 2023-04-06 DIAGNOSIS — J4520 Mild intermittent asthma, uncomplicated: Secondary | ICD-10-CM

## 2023-04-06 DIAGNOSIS — K219 Gastro-esophageal reflux disease without esophagitis: Secondary | ICD-10-CM

## 2023-04-06 MED ORDER — ALBUTEROL SULFATE HFA 108 (90 BASE) MCG/ACT IN AERS: 2.0000 | INHALATION_SPRAY | Freq: Four times a day (QID) | RESPIRATORY_TRACT | 1 refills | Status: DC | PRN

## 2023-04-06 MED ORDER — PROAIR HFA 108 (90 BASE) MCG/ACT IN AERS: 2.0000 | INHALATION_SPRAY | RESPIRATORY_TRACT | 0 refills | Status: DC | PRN

## 2023-04-20 ENCOUNTER — Other Ambulatory Visit: Payer: Self-pay | Admitting: Family

## 2023-04-20 DIAGNOSIS — J4520 Mild intermittent asthma, uncomplicated: Secondary | ICD-10-CM

## 2023-04-20 DIAGNOSIS — K5909 Other constipation: Secondary | ICD-10-CM

## 2023-04-21 MED ORDER — LINACLOTIDE 145 MCG PO CAPS: 145.0000 ug | ORAL_CAPSULE | Freq: Every day | ORAL | 0 refills | Status: DC

## 2023-04-29 ENCOUNTER — Telehealth: Payer: Medicaid Other | Admitting: Physician Assistant

## 2023-04-29 DIAGNOSIS — J302 Other seasonal allergic rhinitis: Secondary | ICD-10-CM

## 2023-04-29 DIAGNOSIS — J4521 Mild intermittent asthma with (acute) exacerbation: Secondary | ICD-10-CM | POA: Diagnosis not present

## 2023-04-29 MED ORDER — PREDNISONE 20 MG PO TABS: 40.0000 mg | ORAL_TABLET | Freq: Every day | ORAL | 0 refills | Status: DC

## 2023-04-29 NOTE — Progress Notes (Signed)
I have spent 5 minutes in review of e-visit questionnaire, review and updating patient chart, medical decision making and response to patient.   William Cody Martin, PA-C    

## 2023-04-29 NOTE — Progress Notes (Signed)
E-Visit for Asthma  Based on what you have shared with me, it looks like you may have a flare up of your asthma.  Asthma is a chronic (ongoing) lung disease which results in airway obstruction, inflammation and hyper-responsiveness.   Asthma symptoms vary from person to person, with common symptoms including nighttime awakening and decreased ability to participate in normal activities as a result of shortness of breath. It is often triggered by changes in weather, changes in the season, changes in air temperature, or inside (home, school, daycare or work) allergens such as animal dander, mold, mildew, woodstoves or cockroaches.   It can also be triggered by hormonal changes, extreme emotion, physical exertion or an upper respiratory tract illness.     It is important to identify the trigger, and then eliminate or avoid the trigger if possible.   If you have been prescribed medications to be taken on a regular basis, it is important to follow the asthma action plan and to follow guidelines to adjust medication in response to increasing symptoms of decreased peak expiratory flow rate  Treatment: I have prescribed: Prednisone 40mg  by mouth per day for 5 - 7 days to use along with the albuterol. Continue nasal spray. Consider switching to Xyzal OTC instead of your current antihistamine.  HOME CARE Only take medications as instructed by your medical team. Consider wearing a mask or scarf to improve breathing air temperature have been shown to decrease irritation and decrease exacerbations Get rest. Taking a steamy shower or using a humidifier may help nasal congestion sand ease sore throat pain. You can place a towel over your head and breathe in the steam from hot water coming from a faucet. Using a saline nasal spray works much the same way.  Cough drops, hare candies and sore throat  lozenges may ease your cough.  Avoid close contacts especially the very you and the elderly Cover your mouth if you cough or sneeze Always remember to wash your hands.    GET HELP RIGHT AWAY IF: You develop worsening symptoms; breathlessness at rest, drowsy, confused or agitated, unable to speak in full sentences You have coughing fits You develop a severe headache or visual changes You develop shortness of breath, difficulty breathing or start having chest pain Your symptoms persist after you have completed your treatment plan If your symptoms do not improve within 10 days  MAKE SURE YOU Understand these instructions. Will watch your condition. Will get help right away if you are not doing well or get worse.   Your e-visit answers were reviewed by a board certified advanced clinical practitioner to complete your personal care plan, Depending upon the condition, your plan could have included both over the counter or prescription medications.   Please review your pharmacy choice. Your safety is important to [place]. If you have drug allergies check your prescription carefully.  You can use MyChart to ask questions about today's visit, request a non-urgent  call back, or ask for a work or school excuse for 24 hours related to this e-Visit. If it has been greater than 24 hours you will need to follow up with your provider, or enter a new e-Visit to address those concerns.   You will get an e-mail in the next two days asking about your experience. I hope that your e-visit has been valuable and will speed your recovery. Thank you for using e-visits.

## 2023-08-26 ENCOUNTER — Telehealth: Payer: Medicaid Other | Admitting: Nurse Practitioner

## 2023-08-26 ENCOUNTER — Telehealth: Payer: Medicaid Other

## 2023-08-26 DIAGNOSIS — J00 Acute nasopharyngitis [common cold]: Secondary | ICD-10-CM | POA: Diagnosis not present

## 2023-08-26 MED ORDER — LORATADINE 10 MG PO TABS: 10.0000 mg | ORAL_TABLET | Freq: Every day | ORAL | 11 refills | Status: DC

## 2023-08-26 MED ORDER — FLUTICASONE PROPIONATE 50 MCG/ACT NA SUSP: 2.0000 | Freq: Every day | NASAL | 6 refills | Status: DC

## 2023-08-26 NOTE — Progress Notes (Signed)
 Virtual Visit Consent   Suzanne Reed, you are scheduled for a virtual visit with a [facility] provider today. Just as with appointments in the office, your consent must be obtained to participate. Your consent will be active for this visit and any virtual visit you may have with one of our providers in the next 365 days. If you have a MyChart account, a copy of this consent can be sent to you electronically.  As this is a virtual visit, video technology does not allow for your provider to perform a traditional examination. This may limit your provider's ability to fully assess your condition. If your provider identifies any concerns that need to be evaluated in person or the need to arrange testing (such as labs, EKG, etc.), we will make arrangements to do so. Although advances in technology are sophisticated, we cannot ensure that it will always work on either your end or our end. If the connection with a video visit is poor, the visit may have to be switched to a telephone visit. With either a video or telephone visit, we are not always able to ensure that we have a secure connection.  By engaging in this virtual visit, you consent to the provision of healthcare and authorize for your insurance to be billed (if applicable) for the services provided during this visit. Depending on your insurance coverage, you may receive a charge related to this service.  I need to obtain your verbal consent now. Are you willing to proceed with your visit today? Suzanne Reed has provided verbal consent on 08/26/2023 for a virtual visit (video or telephone). Lauraine Kitty, FNP  Date: 08/26/2023 5:58 PM  Virtual Visit via Video Note   I, Lauraine Kitty, connected with  Suzanne Reed  ([id number], December 02, 2000) on 08/26/23 at  6:00 PM EST by a video-enabled telemedicine application and verified that I am speaking with the correct person using two identifiers.  Location: Patient: Virtual Visit Location Patient: Home Provider:  Virtual Visit Location Provider: Home Office   I discussed the limitations of evaluation and management by telemedicine and the availability of in person appointments. The patient expressed understanding and agreed to proceed.    History of Present Illness: Suzanne Reed is a 23 y.o. who identifies as a female who was assigned female at birth, and is being seen today for sinus congestion.   She wakes up in the mornings with nasal congestion and post nasal drainage   She typically takes Claritin  daily but her prescription has run out  She does have a nasal spray she has been using   Has a history of allergy and asthma   Denies a fever    Problems:  Patient Active Problem List   Diagnosis Date Noted   Chronic constipation 06/23/2022   Mild intermittent asthma without complication 02/04/2022   Sleep disorder 02/04/2022   Snoring 02/04/2022   Painful menstrual periods 02/04/2022   Acne 02/04/2022   Other fatigue 02/04/2022   Hyperglycemia 02/04/2022   Dense breast tissue 02/04/2022   Current moderate episode of major depressive disorder without prior episode (HCC) 10/31/2020   Other specified anxiety disorders 10/31/2020   PTSD (post-traumatic stress disorder) 10/31/2020    Allergies: No Known Allergies Medications:  Current Outpatient Medications:    albuterol  (VENTOLIN  HFA) 108 (90 Base) MCG/ACT inhaler, Inhale 2 puffs into the lungs every 6 (six) hours as needed for wheezing or shortness of breath., Disp: 1 each, Rfl: 1   benzonatate  (TESSALON ) 100 MG capsule, Take  1 capsule (100 mg total) by mouth 3 (three) times daily as needed., Disp: 30 capsule, Rfl: 0   fluticasone  (FLONASE ) 50 MCG/ACT nasal spray, Place 2 sprays into both nostrils daily., Disp: 16 g, Rfl: 0   linaclotide  (LINZESS ) 145 MCG CAPS capsule, Take 1 capsule (145 mcg total) by mouth daily before breakfast. PLEASE SCHEDULE OFFICE VISIT FOR FURTHER REFILLS, Disp: 30 capsule, Rfl: 0   loratadine  (CLARITIN ) 10 MG  tablet, Take 1 tablet (10 mg total) by mouth daily., Disp: 90 tablet, Rfl: 3   montelukast  (SINGULAIR ) 10 MG tablet, Take 1 tablet (10 mg total) by mouth at bedtime., Disp: 90 tablet, Rfl: 1   omeprazole  (PRILOSEC) 20 MG capsule, Take 1 capsule (20 mg total) by mouth daily., Disp: 90 capsule, Rfl: 0   predniSONE  (DELTASONE ) 20 MG tablet, Take 2 tablets (40 mg total) by mouth daily with breakfast., Disp: 10 tablet, Rfl: 0   sertraline  (ZOLOFT ) 25 MG tablet, Take 1 tablet (25 mg total) by mouth daily., Disp: 30 tablet, Rfl: 1   traZODone  (DESYREL ) 50 MG tablet, TAKE 1/2 TO 1 TABLET BY MOUTH BY MOUTH AT BEDTIME AS NEEDED FOR SLEEP, Disp: 30 tablet, Rfl: 0  Observations/Objective: Patient is well-developed, well-nourished in no acute distress.  Resting comfortably  at home.  Head is normocephalic, atraumatic.  No labored breathing.  Speech is clear and coherent with logical content.  Patient is alert and oriented at baseline.    Assessment and Plan:  1. Acute rhinitis (Primary)  - loratadine  (CLARITIN ) 10 MG tablet; Take 1 tablet (10 mg total) by mouth daily.  Dispense: 30 tablet; Refill: 11 - fluticasone  (FLONASE ) 50 MCG/ACT nasal spray; Place 2 sprays into both nostrils daily.  Dispense: 16 g; Refill: 6     Follow Up Instructions: I discussed the assessment and treatment plan with the patient. The patient was provided an opportunity to ask questions and all were answered. The patient agreed with the plan and demonstrated an understanding of the instructions.  A copy of instructions were sent to the patient via MyChart unless otherwise noted below.    The patient was advised to call back or seek an in-person evaluation if the symptoms worsen or if the condition fails to improve as anticipated.    Lauraine Kitty, FNP

## 2023-08-28 ENCOUNTER — Encounter: Payer: Self-pay | Admitting: Family

## 2023-08-31 NOTE — Telephone Encounter (Signed)
 [email or web address]

## 2023-08-31 NOTE — Telephone Encounter (Signed)
 Needs appt. Not video

## 2023-09-13 ENCOUNTER — Encounter: Payer: Self-pay | Admitting: Family

## 2023-11-13 ENCOUNTER — Encounter: Payer: Self-pay | Admitting: Family

## 2023-11-16 NOTE — Telephone Encounter (Signed)
 Please schedule pt we can discuss possible referral at visit. She has not been seen since 2023 and will need an office visit to document need for referral

## 2023-11-17 ENCOUNTER — Ambulatory Visit: Admitting: Family

## 2023-11-17 NOTE — Telephone Encounter (Signed)
 Call could not be completed.

## 2023-11-23 ENCOUNTER — Ambulatory Visit: Admitting: Family

## 2024-04-07 ENCOUNTER — Encounter: Payer: Self-pay | Admitting: Internal Medicine

## 2024-04-07 ENCOUNTER — Ambulatory Visit: Admitting: Internal Medicine

## 2024-04-07 VITALS — BP 100/68 | Ht 64.0 in | Wt 113.8 lb

## 2024-04-07 DIAGNOSIS — F99 Mental disorder, not otherwise specified: Secondary | ICD-10-CM

## 2024-04-07 DIAGNOSIS — F5105 Insomnia due to other mental disorder: Secondary | ICD-10-CM

## 2024-04-07 DIAGNOSIS — J4520 Mild intermittent asthma, uncomplicated: Secondary | ICD-10-CM

## 2024-04-07 DIAGNOSIS — F4310 Post-traumatic stress disorder, unspecified: Secondary | ICD-10-CM

## 2024-04-07 DIAGNOSIS — G4700 Insomnia, unspecified: Secondary | ICD-10-CM | POA: Insufficient documentation

## 2024-04-07 DIAGNOSIS — K219 Gastro-esophageal reflux disease without esophagitis: Secondary | ICD-10-CM | POA: Insufficient documentation

## 2024-04-07 DIAGNOSIS — F419 Anxiety disorder, unspecified: Secondary | ICD-10-CM | POA: Diagnosis not present

## 2024-04-07 DIAGNOSIS — K5909 Other constipation: Secondary | ICD-10-CM | POA: Diagnosis not present

## 2024-04-07 DIAGNOSIS — F32A Depression, unspecified: Secondary | ICD-10-CM

## 2024-04-07 DIAGNOSIS — N941 Unspecified dyspareunia: Secondary | ICD-10-CM

## 2024-04-07 MED ORDER — PANTOPRAZOLE SODIUM 40 MG PO TBEC: 40.0000 mg | DELAYED_RELEASE_TABLET | Freq: Every day | ORAL | 1 refills | Status: AC

## 2024-04-07 MED ORDER — TRAZODONE HCL 50 MG PO TABS: 50.0000 mg | ORAL_TABLET | Freq: Every day | ORAL | 1 refills | Status: DC

## 2024-04-07 MED ORDER — LINACLOTIDE 145 MCG PO CAPS: 145.0000 ug | ORAL_CAPSULE | Freq: Every day | ORAL | 1 refills | Status: DC

## 2024-04-07 MED ORDER — MONTELUKAST SODIUM 10 MG PO TABS: 10.0000 mg | ORAL_TABLET | Freq: Every day | ORAL | 1 refills | Status: AC

## 2024-04-07 MED ORDER — ALBUTEROL SULFATE HFA 108 (90 BASE) MCG/ACT IN AERS: 2.0000 | INHALATION_SPRAY | Freq: Four times a day (QID) | RESPIRATORY_TRACT | 1 refills | Status: AC | PRN

## 2024-04-07 NOTE — Patient Instructions (Signed)
 GERD in Adults: Diet Changes When you have gastroesophageal reflux disease (GERD), you may need to make changes to your diet. Choosing the right foods can help with your symptoms. Think about working with an expert in healthy eating called a dietitian. They can help you make healthy food choices. What are tips for following this plan? Reading food labels Look for foods that are low in saturated fat. Foods that may help with your symptoms include: Foods with less than 5% of daily value (DV) of fat. Foods with 0 grams of trans fat. Cooking [employer] in ways that don't use a lot of fat. These ways include: Baking. Steaming. Grilling. Broiling. To add flavor, try to use herbs that are low in spice and acidity. Avoid frying your food. Meal planning  Eat small meals often rather than eating 3 large meals each day. Eat your meals slowly in a place where you feel relaxed. If told by your health care provider, avoid: Foods that cause symptoms. Keep a food diary to keep track of foods that cause symptoms. Alcohol. Drinking a lot of liquid with meals. General instructions For 2-3 hours after you eat, avoid: Bending over. Exercise. Lying down. Chew sugar-free gum after meals. What foods should I eat? Eat a healthy diet. Try to include: Foods with high amounts of fiber. These include: Fruits and vegetables. Whole grains and beans. Low-fat dairy products. Lean meats, fish, and poultry. Egg whites. Foods that cause symptoms in someone else may not cause symptoms for you. Work with your provider to find foods that are safe for you. The items listed above may not be all the foods and drinks you can have. Talk with a dietitian to learn more. The items listed above may not be a complete list of foods and beverages you can eat and drink. Contact a dietitian for more information. What foods should I avoid? Limiting some of these foods may help with your symptoms. Each person is different.  Talk with a dietitian or your provider to help you find the exact foods to avoid. Some of the foods to avoid may include: Fruits Fruits with a lot of acid in them. These may include citrus fruits, such as oranges, grapefruit, pineapple, and lemons. Vegetables Deep-fried vegetables, such as [place] fries. Vegetables, sauces, or toppings made with added fat and vegetables with acid in them. These may include tomatoes and tomato products, chili peppers, onions, garlic, and horseradish. Grains Pastries or quick breads with added fat. Meats and other proteins High-fat meats, such as fatty beef or pork, hot dogs, ribs, ham, sausage, salami, and bacon. Fried meat or protein, such as fried fish and fried chicken. Egg yolks. Fats and oils Butter. Margarine. Shortening. Ghee. Drinks Coffee and other drinks with caffeine in them. Fizzy and sugary drinks, such as soda and energy drinks. Fruit juice made with acidic fruits, such as orange or grapefruit. Tomato juice. Sweets and desserts Chocolate and cocoa. Donuts. Seasonings and condiments Mint, such as peppermint and spearmint. Condiments, herbs, or seasonings that cause symptoms. These may include curry, hot sauce, or vinegar-based salad dressings. The items listed above may not be all the foods and drinks you should avoid. Talk with a dietitian to learn more. Questions to ask your health care provider Changes to your diet and everyday life are often the first steps taken to manage symptoms of GERD. If these changes don't help, talk with your provider about taking medicines. Where to find more information International Foundation for Gastrointestinal Disorders:  aboutgerd.org This information is not intended to replace advice given to you by your health care provider. Make sure you discuss any questions you have with your health care provider. Document Revised: 06/16/2023 Document Reviewed: 12/31/2022 Elsevier Patient Education  2024 [employer].

## 2024-04-07 NOTE — Assessment & Plan Note (Signed)
 Not currently medicated She is not interested in seeing a therapist at this time Support offered

## 2024-04-07 NOTE — Assessment & Plan Note (Signed)
 Trazodone  50 mg nightly as needed refilled today Will monitor

## 2024-04-07 NOTE — Assessment & Plan Note (Signed)
 Encouraged high-fiber diet and adequate water intake Linaclotide  145 mcg refilled today

## 2024-04-07 NOTE — Progress Notes (Signed)
 Subjective:    Patient ID: Suzanne Reed, female    DOB: 2001-06-01, 23 y.o.   MRN: [id number]  HPI  Patient presents to clinic today to establish care and for management of the conditions listed below.  Anxiety, depression and PTSD: Chronic, however she is not currently taking any medications in the past. She was prescribed sertraline  in the past but felt like it was ineffective  She is not currently seeing a therapist but has in the past, did not find it effective.  She denies SI/HI.  Insomnia: She has difficulty staying asleep.  She is not trazodone  as prescribed because she ran out. She did find it helpful.  There is no sleep study on file.  Asthma: She reports some shortness of breath but denies chronic cough. Managed with loratadine  and albuterol  as prescribed.  She is prescribed montelukast  but admits that she doesn't take this. There are no PFTs on file.  She does not follow with pulmonology.  Chronic constipation: She has a BM 1-2 x a week. She ran out of her linaclotide  but did find it helpful.  There is no colonoscopy on file.  She does not follow with GI.  GERD: Triggered by red based sauces, acidic foods. She is taking omeprazole  but does not find it effective. There is no upper GI on file. She does not follow with GI.  Review of Systems   No past medical history on file.  Current Outpatient Medications  Medication Sig Dispense Refill   albuterol  (VENTOLIN  HFA) 108 (90 Base) MCG/ACT inhaler Inhale 2 puffs into the lungs every 6 (six) hours as needed for wheezing or shortness of breath. 1 each 1   benzonatate  (TESSALON ) 100 MG capsule Take 1 capsule (100 mg total) by mouth 3 (three) times daily as needed. 30 capsule 0   fluticasone  (FLONASE ) 50 MCG/ACT nasal spray Place 2 sprays into both nostrils daily. 16 g 0   fluticasone  (FLONASE ) 50 MCG/ACT nasal spray Place 2 sprays into both nostrils daily. 16 g 6   linaclotide  (LINZESS ) 145 MCG CAPS capsule Take 1 capsule (145 mcg  total) by mouth daily before breakfast. PLEASE SCHEDULE OFFICE VISIT FOR FURTHER REFILLS 30 capsule 0   loratadine  (CLARITIN ) 10 MG tablet Take 1 tablet (10 mg total) by mouth daily. 90 tablet 3   loratadine  (CLARITIN ) 10 MG tablet Take 1 tablet (10 mg total) by mouth daily. 30 tablet 11   montelukast  (SINGULAIR ) 10 MG tablet Take 1 tablet (10 mg total) by mouth at bedtime. 90 tablet 1   omeprazole  (PRILOSEC) 20 MG capsule Take 1 capsule (20 mg total) by mouth daily. 90 capsule 0   predniSONE  (DELTASONE ) 20 MG tablet Take 2 tablets (40 mg total) by mouth daily with breakfast. 10 tablet 0   sertraline  (ZOLOFT ) 25 MG tablet Take 1 tablet (25 mg total) by mouth daily. 30 tablet 1   traZODone  (DESYREL ) 50 MG tablet TAKE 1/2 TO 1 TABLET BY MOUTH BY MOUTH AT BEDTIME AS NEEDED FOR SLEEP 30 tablet 0   No current facility-administered medications for this visit.    No Known Allergies  No family history on file.  Social History   Socioeconomic History   Marital status: Single    Spouse name: Not on file   Number of children: Not on file   Years of education: Not on file   Highest education level: 12th grade  Occupational History   Not on file  Tobacco Use   Smoking status: Never  Smokeless tobacco: Never  Vaping Use   Vaping status: Never Used  Substance and Sexual Activity   Alcohol use: Never   Drug use: Never   Sexual activity: Yes    Birth control/protection: Condom  Other Topics Concern   Not on file  Social History Narrative   Not on file   Social Drivers of Health   Financial Resource Strain: Low Risk  (03/19/2023)   Overall Financial Resource Strain (CARDIA)    Difficulty of Paying Living Expenses: Not very hard  Food Insecurity: Food Insecurity Present (03/19/2023)   Hunger Vital Sign    Worried About Running Out of Food in the Last Year: Never true    Ran Out of Food in the Last Year: Sometimes true  Transportation Needs: Unknown (03/19/2023)   PRAPARE - Transportation     Lack of Transportation (Medical): No    Lack of Transportation (Non-Medical): Patient declined  Physical Activity: Sufficiently Active (03/19/2023)   Exercise Vital Sign    Days of Exercise per Week: 5 days    Minutes of Exercise per Session: 30 min  Stress: No Stress Concern Present (03/19/2023)   [employer] of Occupational Health - Occupational Stress Questionnaire    Feeling of Stress : Only a little  Social Connections: Moderately Integrated (03/19/2023)   Social Connection and Isolation Panel    Frequency of Communication with Friends and Family: More than three times a week    Frequency of Social Gatherings with Friends and Family: Twice a week    Attends Religious Services: 1 to 4 times per year    Active Member of [employer] or Organizations: No    Attends Engineer, structural: Not on file    Marital Status: Living with partner  Intimate Partner Violence: Not on file     Constitutional: Denies fever, malaise, fatigue, headache or abrupt weight changes.  HEENT: Denies eye pain, eye redness, ear pain, ringing in the ears, wax buildup, runny nose, nasal congestion, bloody nose, or sore throat. Respiratory: Patient reports intermittent shortness of breath.  Denies difficulty breathing, cough or sputum production.   Cardiovascular: Denies chest pain, chest tightness, palpitations or swelling in the hands or feet.  Gastrointestinal: Patient reports chronic constipation, reflux, pelvic pain after intercourse.  Denies abdominal pain, bloating, diarrhea or blood in the stool.  GU: Denies urgency, frequency, pain with urination, burning sensation, blood in urine, odor or discharge. Musculoskeletal: Denies decrease in range of motion, difficulty with gait, muscle pain or joint pain and swelling.  Skin: Denies redness, rashes, lesions or ulcercations.  Neurological: Patient reports insomnia.  Denies dizziness, difficulty with memory, difficulty with speech or problems with balance  and coordination.  Psych: Patient has a history of anxiety and depression.  Denies  SI/HI.  No other specific complaints in a complete review of systems (except as listed in HPI above).      Objective:   Physical Exam  BP 100/68 (BP Location: Left Arm, Patient Position: Sitting, Cuff Size: Normal)   Ht 5' 4 (1.626 m)   Wt 113 lb 12.8 oz (51.6 kg)   LMP 03/11/2024 (Approximate)   BMI 19.53 kg/m   Wt Readings from Last 3 Encounters:  06/23/22 105 lb (47.6 kg) (8%, Z= -1.39)*  04/10/22 100 lb (45.4 kg) (4%, Z= -1.80)*  02/04/22 98 lb 6 oz (44.6 kg) (3%, Z= -1.94)*   * Growth percentiles are based on CDC (Girls, 2-20 Years) data.    General: Appears her stated age, well developed,  well nourished in NAD. Skin: Warm, dry and intact. HEENT: Head: normal shape and size; Eyes: sclera white, no icterus, conjunctiva pink, PERRLA and EOMs intact;  Cardiovascular: Normal rate and rhythm. S1,S2 noted.  No murmur, rubs or gallops noted. No JVD or BLE edema.  Pulmonary/Chest: Normal effort and positive vesicular breath sounds. No respiratory distress. No wheezes, rales or ronchi noted.  Abdomen: Soft and nontender. Normal bowel sounds.  Musculoskeletal: No difficulty with gait.  Neurological: Alert and oriented. Coordination normal.  Psychiatric: Mood and affect normal. Behavior is normal. Judgment and thought content normal.    BMET    Component Value Date/Time   NA 137 02/06/2022 0948   K 3.8 02/06/2022 0948   CL 103 02/06/2022 0948   CO2 27 02/06/2022 0948   GLUCOSE 87 02/06/2022 0948   BUN 6 02/06/2022 0948   CREATININE 0.70 02/06/2022 0948   CALCIUM 9.2 02/06/2022 0948   GFRNONAA >60 11/29/2021 2117    Lipid Panel     Component Value Date/Time   CHOL 188 02/06/2022 0948   TRIG 88.0 02/06/2022 0948   HDL 70.50 02/06/2022 0948   CHOLHDL 3 02/06/2022 0948   VLDL 17.6 02/06/2022 0948   LDLCALC 100 (H) 02/06/2022 0948    CBC    Component Value Date/Time   WBC 8.5  02/06/2022 0948   RBC 4.72 02/06/2022 0948   HGB 14.1 02/06/2022 0948   HCT 41.6 02/06/2022 0948   PLT 259.0 02/06/2022 0948   MCV 88.1 02/06/2022 0948   MCH 29.4 11/29/2021 2117   MCHC 34.0 02/06/2022 0948   RDW 13.0 02/06/2022 0948   LYMPHSABS 2.6 02/06/2022 0948   MONOABS 0.7 02/06/2022 0948   EOSABS 0.1 02/06/2022 0948   BASOSABS 0.0 02/06/2022 0948    Hgb A1C Lab Results  Component Value Date   HGBA1C 5.1 02/06/2022            Assessment & Plan:   Dyspareunia:  Pelvic ultrasound from 02/2022 reviewed, normal Advised her to monitor symptoms for now and let me know if persist or worsen RTC in 6 months for your annual exam Angeline Laura, NP

## 2024-04-07 NOTE — Assessment & Plan Note (Signed)
 Avoid foods that trigger reflux Will discontinue omeprazole  as she reports this is ineffective Rx for pantoprazole  40 mg daily, will reevaluate dose at next visit

## 2024-04-07 NOTE — Assessment & Plan Note (Signed)
 Montelukast  10 mg and albuterol  inhaler refilled today

## 2024-04-08 ENCOUNTER — Ambulatory Visit

## 2024-04-20 ENCOUNTER — Encounter: Payer: Self-pay | Admitting: Internal Medicine

## 2024-06-03 ENCOUNTER — Encounter: Payer: Self-pay | Admitting: Internal Medicine

## 2024-06-16 ENCOUNTER — Ambulatory Visit: Admitting: Internal Medicine

## 2024-06-27 ENCOUNTER — Encounter: Payer: Self-pay | Admitting: Internal Medicine

## 2024-07-07 ENCOUNTER — Other Ambulatory Visit (HOSPITAL_COMMUNITY): Payer: Self-pay

## 2024-07-07 ENCOUNTER — Telehealth: Payer: Self-pay

## 2024-07-07 MED ORDER — FEXOFENADINE HCL 180 MG PO TABS: 180.0000 mg | ORAL_TABLET | Freq: Every day | ORAL | 0 refills | Status: AC

## 2024-07-07 NOTE — Addendum Note (Signed)
 Addended by: ANTONETTE ANGELINE ORN on: 07/07/2024 09:50 AM   Modules accepted: Orders

## 2024-07-07 NOTE — Telephone Encounter (Signed)
 Pharmacy Patient Advocate Encounter   Received notification from Onbase that prior authorization for fexofenadine (ALLEGRA ALLERGY) 180 MG tablet  is required/requested.   Insurance verification completed.   The patient is insured through [employer].   Per test claim: PA required; PA submitted to above mentioned insurance via Latent Key/confirmation #/EOC [id number] Status is pending

## 2024-07-07 NOTE — Telephone Encounter (Signed)
 Pharmacy Patient Advocate Encounter  Received notification from [employer] that Prior Authorization for FEXOFENADINE 180MG  has been APPROVED from 07/07/24 to 07/07/25   PA #/Case ID/Reference #: [id number]

## 2024-07-18 ENCOUNTER — Ambulatory Visit: Admitting: Internal Medicine

## 2024-08-15 ENCOUNTER — Ambulatory Visit: Admitting: Internal Medicine

## 2024-09-22 NOTE — Progress Notes (Unsigned)
 "  Subjective:    Patient ID: Suzanne Reed, female    DOB: [birth year], 24 y.o.   MRN: [id number]  HPI  Patient presents to the clinic today for her annual exam.  Flu: 07/2020 Tetanus: 12/2014 COVID: Pap smear: Never Dentist:  Diet: Exercise:  Past Medical History:  Diagnosis Date   Anxiety    Asthma     Current Outpatient Medications  Medication Sig Dispense Refill   albuterol  (VENTOLIN  HFA) 108 (90 Base) MCG/ACT inhaler Inhale 2 puffs into the lungs every 6 (six) hours as needed for wheezing or shortness of breath. 1 each 1   fexofenadine  (ALLEGRA  ALLERGY) 180 MG tablet Take 1 tablet (180 mg total) by mouth daily. 90 tablet 0   linaclotide  (LINZESS ) 145 MCG CAPS capsule Take 1 capsule (145 mcg total) by mouth daily before breakfast. 90 capsule 1   montelukast  (SINGULAIR ) 10 MG tablet Take 1 tablet (10 mg total) by mouth at bedtime. 90 tablet 1   pantoprazole  (PROTONIX ) 40 MG tablet Take 1 tablet (40 mg total) by mouth daily. 90 tablet 1   traZODone  (DESYREL ) 50 MG tablet Take 1 tablet (50 mg total) by mouth at bedtime. 90 tablet 1   No current facility-administered medications for this visit.    Allergies[1]  Family History  Problem Relation Age of Onset   Depression Mother    Depression Father     Social History   Socioeconomic History   Marital status: Single    Spouse name: Not on file   Number of children: Not on file   Years of education: Not on file   Highest education level: 12th grade  Occupational History   Not on file  Tobacco Use   Smoking status: Never   Smokeless tobacco: Never  Vaping Use   Vaping status: Never Used  Substance and Sexual Activity   Alcohol use: Never   Drug use: Never   Sexual activity: Yes    Birth control/protection: Condom  Other Topics Concern   Not on file  Social History Narrative   Not on file   Social Drivers of Health   Tobacco Use: Low Risk (04/07/2024)   Patient History    Smoking Tobacco Use: Never     Smokeless Tobacco Use: Never    Passive Exposure: Not on file  Financial Resource Strain: Low Risk (03/19/2023)   Overall Financial Resource Strain (CARDIA)    Difficulty of Paying Living Expenses: Not very hard  Food Insecurity: Food Insecurity Present (03/19/2023)   Hunger Vital Sign    Worried About Running Out of Food in the Last Year: Never true    Ran Out of Food in the Last Year: Sometimes true  Transportation Needs: Unknown (03/19/2023)   PRAPARE - Transportation    Lack of Transportation (Medical): No    Lack of Transportation (Non-Medical): Patient declined  Physical Activity: Sufficiently Active (03/19/2023)   Exercise Vital Sign    Days of Exercise per Week: 5 days    Minutes of Exercise per Session: 30 min  Stress: No Stress Concern Present (03/19/2023)   [employer] of Occupational Health - Occupational Stress Questionnaire    Feeling of Stress : Only a little  Social Connections: Moderately Integrated (03/19/2023)   Social Connection and Isolation Panel    Frequency of Communication with Friends and Family: More than three times a week    Frequency of Social Gatherings with Friends and Family: Twice a week    Attends Religious Services: 1 to 4  times per year    Active Member of Clubs or Organizations: No    Attends Banker Meetings: Not on file    Marital Status: Living with partner  Intimate Partner Violence: Not on file  Depression (PHQ2-9): High Risk (04/07/2024)   Depression (PHQ2-9)    PHQ-2 Score: 16  Alcohol Screen: Not on file  Housing: Low Risk (03/19/2023)   Housing    Last Housing Risk Score: 0  Utilities: Not on file  Health Literacy: Not on file     Constitutional: Denies fever, malaise, fatigue, headache or abrupt weight changes.  HEENT: Denies eye pain, eye redness, ear pain, ringing in the ears, wax buildup, runny nose, nasal congestion, bloody nose, or sore throat. Respiratory: Denies difficulty breathing, shortness of breath, cough or  sputum production.   Cardiovascular: Denies chest pain, chest tightness, palpitations or swelling in the hands or feet.  Gastrointestinal: Patient reports chronic constipation.  Denies abdominal pain, bloating, diarrhea or blood in the stool.  GU: Denies urgency, frequency, pain with urination, burning sensation, blood in urine, odor or discharge. Musculoskeletal: Denies decrease in range of motion, difficulty with gait, muscle pain or joint pain and swelling.  Skin: Denies redness, rashes, lesions or ulcercations.  Neurological: Patient reports insomnia.  Denies dizziness, difficulty with memory, difficulty with speech or problems with balance and coordination.  Psych: Patient has a history of anxiety and depression.  Denies SI/HI.  No other specific complaints in a complete review of systems (except as listed in HPI above).  Objective    There were no vitals taken for this visit. Wt Readings from Last 3 Encounters:  04/07/24 113 lb 12.8 oz (51.6 kg)  06/23/22 105 lb (47.6 kg) (8%, Z= -1.39)*  04/10/22 100 lb (45.4 kg) (4%, Z= -1.80)*   * Growth percentiles are based on CDC (Girls, 2-20 Years) data.    General: Appears their stated age, well developed, well nourished in NAD. Skin: Warm, dry and intact. No rashes, lesions or ulcerations noted. HEENT: Head: normal shape and size; Eyes: sclera white, no icterus, conjunctiva pink, PERRLA and EOMs intact; Ears: Tm's gray and intact, normal light reflex; Nose: mucosa pink and moist, septum midline; Throat/Mouth: Teeth present, mucosa pink and moist, no exudate, lesions or ulcerations noted.  Neck:  Neck supple, trachea midline. No masses, lumps or thyromegaly present.  Cardiovascular: Normal rate and rhythm. S1,S2 noted.  No murmur, rubs or gallops noted. No JVD or BLE edema. No carotid bruits noted. Pulmonary/Chest: Normal effort and positive vesicular breath sounds. No respiratory distress. No wheezes, rales or ronchi noted.  Abdomen: Soft  and nontender. Normal bowel sounds. No distention or masses noted. Liver, spleen and kidneys non palpable. Musculoskeletal: Normal range of motion. No signs of joint swelling. No difficulty with gait.  Neurological: Alert and oriented. Cranial nerves II-XII grossly intact. Coordination normal.  Psychiatric: Mood and affect normal. Behavior is normal. Judgment and thought content normal.   EKG:  BMET    Component Value Date/Time   NA 137 02/06/2022 0948   K 3.8 02/06/2022 0948   CL 103 02/06/2022 0948   CO2 27 02/06/2022 0948   GLUCOSE 87 02/06/2022 0948   BUN 6 02/06/2022 0948   CREATININE 0.70 02/06/2022 0948   CALCIUM 9.2 02/06/2022 0948   GFRNONAA >60 11/29/2021 2117    Lipid Panel     Component Value Date/Time   CHOL 188 02/06/2022 0948   TRIG 88.0 02/06/2022 0948   HDL 70.50 02/06/2022 0948  CHOLHDL 3 02/06/2022 0948   VLDL 17.6 02/06/2022 0948   LDLCALC 100 (H) 02/06/2022 0948    CBC    Component Value Date/Time   WBC 8.5 02/06/2022 0948   RBC 4.72 02/06/2022 0948   HGB 14.1 02/06/2022 0948   HCT 41.6 02/06/2022 0948   PLT 259.0 02/06/2022 0948   MCV 88.1 02/06/2022 0948   MCH 29.4 11/29/2021 2117   MCHC 34.0 02/06/2022 0948   RDW 13.0 02/06/2022 0948   LYMPHSABS 2.6 02/06/2022 0948   MONOABS 0.7 02/06/2022 0948   EOSABS 0.1 02/06/2022 0948   BASOSABS 0.0 02/06/2022 0948    Hgb A1C Lab Results  Component Value Date   HGBA1C 5.1 02/06/2022       Assessment and Plan   Preventative health maintenance:  Flu shot Tetanus UTD Encouraged her to get his COVID-vaccine Pap smear today Encouraged him to consume a balanced diet and exercise regimen Advised her to see an eye doctor and dentist annually We will check CBC, c-Met, lipid, A1c today   RTC in 6 months for follow-up of chronic conditions Angeline Laura, NP    [1]  Allergies Allergen Reactions   Bee Pollen Hives   "

## 2024-09-23 ENCOUNTER — Encounter: Payer: Self-pay | Admitting: Internal Medicine

## 2024-09-23 ENCOUNTER — Ambulatory Visit: Admitting: Internal Medicine

## 2024-09-23 VITALS — BP 102/70 | Ht 62.75 in | Wt 113.6 lb

## 2024-09-23 DIAGNOSIS — R739 Hyperglycemia, unspecified: Secondary | ICD-10-CM

## 2024-09-23 DIAGNOSIS — Z0000 Encounter for general adult medical examination without abnormal findings: Secondary | ICD-10-CM

## 2024-09-23 DIAGNOSIS — Z1159 Encounter for screening for other viral diseases: Secondary | ICD-10-CM

## 2024-09-23 DIAGNOSIS — Z23 Encounter for immunization: Secondary | ICD-10-CM

## 2024-09-23 DIAGNOSIS — Z114 Encounter for screening for human immunodeficiency virus [HIV]: Secondary | ICD-10-CM

## 2024-09-23 DIAGNOSIS — Z136 Encounter for screening for cardiovascular disorders: Secondary | ICD-10-CM

## 2024-09-23 LAB — LIPID PANEL
Cholesterol: 199 mg/dL
HDL: 58 mg/dL
LDL Cholesterol (Calc): 125 mg/dL — ABNORMAL HIGH
Non-HDL Cholesterol (Calc): 141 mg/dL — ABNORMAL HIGH
Total CHOL/HDL Ratio: 3.4 (calc)
Triglycerides: 67 mg/dL

## 2024-09-23 LAB — COMPREHENSIVE METABOLIC PANEL WITH GFR
AG Ratio: 1.6 (calc) (ref 1.0–2.5)
ALT: 9 U/L (ref 6–29)
AST: 12 U/L (ref 10–30)
Albumin: 4.5 g/dL (ref 3.6–5.1)
Alkaline phosphatase (APISO): 67 U/L (ref 31–125)
BUN: 8 mg/dL (ref 7–25)
CO2: 28 mmol/L (ref 20–32)
Calcium: 9.4 mg/dL (ref 8.6–10.2)
Chloride: 102 mmol/L (ref 98–110)
Creat: 0.65 mg/dL (ref 0.50–0.96)
Globulin: 2.8 g/dL (ref 1.9–3.7)
Glucose, Bld: 100 mg/dL — ABNORMAL HIGH (ref 65–99)
Potassium: 4.5 mmol/L (ref 3.5–5.3)
Sodium: 138 mmol/L (ref 135–146)
Total Bilirubin: 0.4 mg/dL (ref 0.2–1.2)
Total Protein: 7.3 g/dL (ref 6.1–8.1)
eGFR: 128 mL/min/{1.73_m2}

## 2024-09-23 LAB — CBC
HCT: 45.4 % (ref 35.9–46.0)
Hemoglobin: 15.1 g/dL (ref 11.7–15.5)
MCH: 30 pg (ref 27.0–33.0)
MCHC: 33.3 g/dL (ref 31.6–35.4)
MCV: 90.3 fL (ref 81.4–101.7)
MPV: 11.7 fL (ref 7.5–12.5)
Platelets: 354 10*3/uL (ref 140–400)
RBC: 5.03 Million/uL (ref 3.80–5.10)
RDW: 12.3 % (ref 11.0–15.0)
WBC: 6 10*3/uL (ref 3.8–10.8)

## 2024-09-23 MED ORDER — BUSPIRONE HCL 7.5 MG PO TABS: 7.5000 mg | ORAL_TABLET | Freq: Every day | ORAL | 1 refills | Status: AC | PRN

## 2024-09-23 NOTE — Patient Instructions (Signed)
 Health Maintenance, Female Adopting a healthy lifestyle and getting preventive care are important in promoting health and wellness. Ask your health care provider about: The right schedule for you to have regular tests and exams. Things you can do on your own to prevent diseases and keep yourself healthy. What should I know about diet, weight, and exercise? Eat a healthy diet  Eat a diet that includes plenty of vegetables, fruits, low-fat dairy products, and lean protein. Do not eat a lot of foods that are high in solid fats, added sugars, or sodium. Maintain a healthy weight Body mass index (BMI) is used to identify weight problems. It estimates body fat based on height and weight. Your health care provider can help determine your BMI and help you achieve or maintain a healthy weight. Get regular exercise Get regular exercise. This is one of the most important things you can do for your health. Most adults should: Exercise for at least 150 minutes each week. The exercise should increase your heart rate and make you sweat (moderate-intensity exercise). Do strengthening exercises at least twice a week. This is in addition to the moderate-intensity exercise. Spend less time sitting. Even light physical activity can be beneficial. Watch cholesterol and blood lipids Have your blood tested for lipids and cholesterol at 22 years of age, then have this test every 5 years. Have your cholesterol levels checked more often if: Your lipid or cholesterol levels are high. You are older than 22 years of age. You are at high risk for heart disease. What should I know about cancer screening? Depending on your health history and family history, you may need to have cancer screening at various ages. This may include screening for: Breast cancer. Cervical cancer. Colorectal cancer. Skin cancer. Lung cancer. What should I know about heart disease, diabetes, and high blood pressure? Blood pressure and heart  disease High blood pressure causes heart disease and increases the risk of stroke. This is more likely to develop in people who have high blood pressure readings or are overweight. Have your blood pressure checked: Every 3-5 years if you are 32-37 years of age. Every year if you are 71 years old or older. Diabetes Have regular diabetes screenings. This checks your fasting blood sugar level. Have the screening done: Once every three years after age 24 if you are at a normal weight and have a low risk for diabetes. More often and at a younger age if you are overweight or have a high risk for diabetes. What should I know about preventing infection? Hepatitis B If you have a higher risk for hepatitis B, you should be screened for this virus. Talk with your health care provider to find out if you are at risk for hepatitis B infection. Hepatitis C Testing is recommended for: Everyone born from [birth year] through 1963. Anyone with known risk factors for hepatitis C. Sexually transmitted infections (STIs) Get screened for STIs, including gonorrhea and chlamydia, if: You are sexually active and are younger than 22 years of age. You are older than 23 years of age and your health care provider tells you that you are at risk for this type of infection. Your sexual activity has changed since you were last screened, and you are at increased risk for chlamydia or gonorrhea. Ask your health care provider if you are at risk. Ask your health care provider about whether you are at high risk for HIV. Your health care provider may recommend a prescription medicine to help prevent HIV  infection. If you choose to take medicine to prevent HIV, you should first get tested for HIV. You should then be tested every 3 months for as long as you are taking the medicine. Pregnancy If you are about to stop having your period (premenopausal) and you may become pregnant, seek counseling before you get pregnant. Take 400 to 800  micrograms (mcg) of folic acid every day if you become pregnant. Ask for birth control (contraception) if you want to prevent pregnancy. Osteoporosis and menopause Osteoporosis is a disease in which the bones lose minerals and strength with aging. This can result in bone fractures. If you are 42 years old or older, or if you are at risk for osteoporosis and fractures, ask your health care provider if you should: Be screened for bone loss. Take a calcium  or vitamin D  supplement to lower your risk of fractures. Be given hormone replacement therapy (HRT) to treat symptoms of menopause. Follow these instructions at home: Alcohol use Do not drink alcohol if: Your health care provider tells you not to drink. You are pregnant, may be pregnant, or are planning to become pregnant. If you drink alcohol: Limit how much you have to: 0-1 drink a day. Know how much alcohol is in your drink. In the [place] one drink equals one 12 oz bottle of beer (355 mL), one 5 oz glass of wine (148 mL), or one 1 oz glass of hard liquor (44 mL). Lifestyle Do not use any products that contain nicotine or tobacco. These products include cigarettes, chewing tobacco, and vaping devices, such as e-cigarettes. If you need help quitting, ask your health care provider. Do not use street drugs. Do not share needles. Ask your health care provider for help if you need support or information about quitting drugs. General instructions Schedule regular health, dental, and eye exams. Stay current with your vaccines. Tell your health care provider if: You often feel depressed. You have ever been abused or do not feel safe at home. This information is not intended to replace advice given to you by your health care provider. Make sure you discuss any questions you have with your health care provider. Document Revised: 02/10/2024 Document Reviewed: 12/24/2020 Elsevier Patient Education  2025 [employer].

## 2024-10-04 ENCOUNTER — Encounter: Admitting: Internal Medicine

## 2025-03-29 ENCOUNTER — Ambulatory Visit: Admitting: Internal Medicine
